# Patient Record
Sex: Male | Born: 1955 | Race: White | Hispanic: No | Marital: Single | Smoking: Current every day smoker
Health system: Southern US, Community
[De-identification: ages and names within clinical notes are randomized; demographics above are authoritative.]

## PROBLEM LIST (undated history)

## (undated) DIAGNOSIS — Z9981 Dependence on supplemental oxygen: Secondary | ICD-10-CM

## (undated) DIAGNOSIS — I10 Essential (primary) hypertension: Secondary | ICD-10-CM

## (undated) DIAGNOSIS — I82409 Acute embolism and thrombosis of unspecified deep veins of unspecified lower extremity: Secondary | ICD-10-CM

## (undated) DIAGNOSIS — M549 Dorsalgia, unspecified: Secondary | ICD-10-CM

## (undated) DIAGNOSIS — I2510 Atherosclerotic heart disease of native coronary artery without angina pectoris: Secondary | ICD-10-CM

## (undated) DIAGNOSIS — R634 Abnormal weight loss: Secondary | ICD-10-CM

## (undated) DIAGNOSIS — C649 Malignant neoplasm of unspecified kidney, except renal pelvis: Secondary | ICD-10-CM

## (undated) DIAGNOSIS — I208 Other forms of angina pectoris: Secondary | ICD-10-CM

## (undated) DIAGNOSIS — J449 Chronic obstructive pulmonary disease, unspecified: Secondary | ICD-10-CM

## (undated) DIAGNOSIS — E7800 Pure hypercholesterolemia, unspecified: Secondary | ICD-10-CM

## (undated) HISTORY — PX: BACK SURGERY: SHX140

## (undated) HISTORY — DX: Malignant neoplasm of unspecified kidney, except renal pelvis: C64.9

## (undated) HISTORY — DX: Acute embolism and thrombosis of unspecified deep veins of unspecified lower extremity: I82.409

## (undated) HISTORY — DX: Abnormal weight loss: R63.4

## (undated) HISTORY — DX: Dependence on supplemental oxygen: Z99.81

## (undated) HISTORY — PX: NECK SURGERY: SHX720

## (undated) HISTORY — DX: Dorsalgia, unspecified: M54.9

## (undated) HISTORY — DX: Chronic obstructive pulmonary disease, unspecified: J44.9

---

## 2008-12-22 ENCOUNTER — Emergency Department (HOSPITAL_COMMUNITY): Admission: EM | Admit: 2008-12-22 | Discharge: 2008-12-22 | Payer: Self-pay | Admitting: Emergency Medicine

## 2009-01-09 ENCOUNTER — Emergency Department (HOSPITAL_COMMUNITY): Admission: EM | Admit: 2009-01-09 | Discharge: 2009-01-09 | Payer: Self-pay | Admitting: Emergency Medicine

## 2009-05-06 ENCOUNTER — Emergency Department (HOSPITAL_COMMUNITY): Admission: EM | Admit: 2009-05-06 | Discharge: 2009-05-06 | Payer: Self-pay | Admitting: Emergency Medicine

## 2009-05-13 ENCOUNTER — Emergency Department (HOSPITAL_COMMUNITY): Admission: EM | Admit: 2009-05-13 | Discharge: 2009-05-13 | Payer: Self-pay | Admitting: Emergency Medicine

## 2009-05-25 ENCOUNTER — Emergency Department (HOSPITAL_COMMUNITY): Admission: EM | Admit: 2009-05-25 | Discharge: 2009-05-25 | Payer: Self-pay | Admitting: Emergency Medicine

## 2009-06-18 ENCOUNTER — Emergency Department (HOSPITAL_COMMUNITY): Admission: EM | Admit: 2009-06-18 | Discharge: 2009-06-18 | Payer: Self-pay | Admitting: Emergency Medicine

## 2009-08-04 ENCOUNTER — Emergency Department (HOSPITAL_COMMUNITY): Admission: EM | Admit: 2009-08-04 | Discharge: 2009-08-04 | Payer: Self-pay | Admitting: Emergency Medicine

## 2010-04-02 ENCOUNTER — Ambulatory Visit (HOSPITAL_COMMUNITY): Admission: RE | Admit: 2010-04-02 | Discharge: 2010-04-02 | Payer: Self-pay | Admitting: Family Medicine

## 2010-11-20 ENCOUNTER — Ambulatory Visit (HOSPITAL_COMMUNITY)
Admission: RE | Admit: 2010-11-20 | Discharge: 2010-11-20 | Payer: Self-pay | Source: Home / Self Care | Attending: Family Medicine | Admitting: Family Medicine

## 2010-12-21 ENCOUNTER — Encounter: Payer: Self-pay | Admitting: Family Medicine

## 2011-01-02 ENCOUNTER — Inpatient Hospital Stay (HOSPITAL_COMMUNITY)
Admission: RE | Admit: 2011-01-02 | Discharge: 2011-01-04 | DRG: 473 | Disposition: A | Discharge status: Home / Self Care | Payer: Medicaid Other | Source: Ambulatory Visit | Attending: Neurosurgery | Admitting: Neurosurgery

## 2011-01-02 ENCOUNTER — Ambulatory Visit (HOSPITAL_COMMUNITY): Payer: Medicaid Other

## 2011-01-02 DIAGNOSIS — F17200 Nicotine dependence, unspecified, uncomplicated: Secondary | ICD-10-CM | POA: Diagnosis present

## 2011-01-02 DIAGNOSIS — M5030 Other cervical disc degeneration, unspecified cervical region: Principal | ICD-10-CM | POA: Diagnosis present

## 2011-01-02 DIAGNOSIS — Z981 Arthrodesis status: Secondary | ICD-10-CM

## 2011-01-02 LAB — BASIC METABOLIC PANEL
BUN: 16 mg/dL (ref 6–23)
CO2: 29 mEq/L (ref 19–32)
Chloride: 104 mEq/L (ref 96–112)
GFR calc Af Amer: >60 mL/min (ref >60)
Glucose, Bld: 84 mg/dL (ref 70–99)
Potassium: 5.1 mEq/L (ref 3.5–5.1)
Sodium: 140 mEq/L (ref 135–145)

## 2011-01-02 LAB — CBC
HCT: 46.3 % (ref 39.0–52.0)
MCH: 30.9 pg (ref 26.0–34.0)
MCHC: 32.6 g/dL (ref 30.0–36.0)
Platelets: 252 10*3/uL (ref 150–400)
RDW: 13.9 % (ref 11.5–15.5)

## 2011-01-02 LAB — SURGICAL PCR SCREEN: Staphylococcus aureus: NEGATIVE

## 2011-01-13 NOTE — Op Note (Signed)
  [name], Eric Villarreal NO.:  [id number]  MEDICAL RECORD NO.:  [id number]           PATIENT TYPE:  I  LOCATION:  3035                         FACILITY:  [facility]  PHYSICIAN:  Hilda Lias, M.D.   DATE OF BIRTH:  September 15, 1956  DATE OF PROCEDURE:  01/02/2011 DATE OF DISCHARGE:                              OPERATIVE REPORT   PREOPERATIVE DIAGNOSIS:  Degenerative disk disease C5-6 and C6-7 with a chronic radiculopathy status post fusion anterior-posterior C7-T1.  POSTOPERATIVE DIAGNOSES:  Degenerative disk disease C5-6 and C6-7 with a chronic radiculopathy status post fusion anterior-posterior C7-T1.  PROCEDURE:  Anterior C5-6 and C6-7 diskectomy with decompression of the spinal cord, bilateral foraminotomy, interbody fusion with auto and allograft plate microscope.  SURGEON:  Hilda Lias, MD  ASSISTANT:  Hewitt Shorts, MD  CLINICAL HISTORY:  The patient is a 55 year old gentleman who underwent thee neck surgeries in [place], [place].  Two of them posterior, one anterior in the right side.  He came to my office complaining of neck pain with radiation to both upper extremities.  X-rays showed severe degenerative disk disease with stenosis of C5-6 and C6-7.  Surgery was advised.  PROCEDURE:  The patient was taken to the OR, and the right side of the neck was cleaned with DuraPrep.  Transverse incision about 2 inches from the previous one was made through the skin, subcutaneous tissue, platysma, down to cervical spine.  X-ray showed that we were at the C6- 7.  Two anterior osteophytes were removed, and we brought the microscope immediately.  With the drill and the microcurettes, we did decompression of C5-6 and C6-7.  Using the 1- and 2-mm Kerrison punch, we opened the posterior ligament and decompressed the spinal cord as well as the C6 nerve root and C7 at the level of C6-7 was done.  The endplate were drilled.  Two pieces of allograft, 7 mm height,  lordotic, with autograft inside, were inserted followed by a plate using 6 screws.  Lateral x- rays showed good position of the graft and the plate.  We achieved good hemostasis, but although he has previous surgery with obvious scar we left a drain.  The wound was closed with Vicryl and Steri-Strips.          ______________________________ Hilda Lias, M.D.     EB/MEDQ  D:  01/02/2011  T:  01/03/2011  Job:  [id number]  Electronically Signed by Hilda Lias M.D. on 01/07/2011 09:41:42 AM

## 2012-07-15 ENCOUNTER — Emergency Department (HOSPITAL_COMMUNITY)
Admission: EM | Admit: 2012-07-15 | Discharge: 2012-07-15 | Disposition: A | Discharge status: Home / Self Care | Payer: Medicaid Other | Attending: Emergency Medicine | Admitting: Emergency Medicine

## 2012-07-15 ENCOUNTER — Encounter (HOSPITAL_COMMUNITY): Payer: Self-pay | Admitting: *Deleted

## 2012-07-15 DIAGNOSIS — L03317 Cellulitis of buttock: Secondary | ICD-10-CM | POA: Insufficient documentation

## 2012-07-15 DIAGNOSIS — F17200 Nicotine dependence, unspecified, uncomplicated: Secondary | ICD-10-CM | POA: Insufficient documentation

## 2012-07-15 DIAGNOSIS — L0231 Cutaneous abscess of buttock: Secondary | ICD-10-CM | POA: Insufficient documentation

## 2012-07-15 MED ORDER — OXYCODONE-ACETAMINOPHEN 5-325 MG PO TABS: ORAL_TABLET | ORAL | Status: DC

## 2012-07-15 NOTE — ED Provider Notes (Signed)
History     CSN: [id number]  Arrival date & time 07/15/12  Eric Villarreal   First MD Initiated Contact with Patient 07/15/12 2015      Chief Complaint  Patient presents with  . 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  States he saw his surgeon in [place] who started him on clindamycin 3  Days ago.  He can see him again on Monday.  Wants something for pain in the meantime.  Patient is a 56 y.o. male presenting with abscess. The history is provided by the patient. No language interpreter was used.  Abscess  This is a new problem. Episode onset: 3 days ago. The problem has been gradually worsening. Affected Location: R buttock. The abscess is characterized by painfulness and swelling. Pertinent negatives include no fever. There were no sick contacts.    History reviewed. No pertinent past medical history.  Past Surgical History  Procedure Date  . Neck surgery     No family history on file.  History  Substance Use Topics  . Smoking status: Current Everyday Smoker -- 2.0 packs/day  . Smokeless tobacco: Not on file  . Alcohol Use: No      Review of Systems  Constitutional: Negative for fever and chills.  Skin:       Abscess   All other systems reviewed and are negative.    Allergies  Doxycycline  Home Medications   Current Outpatient Rx  Name Route Sig Dispense Refill  . CLINDAMYCIN HCL 150 MG PO CAPS Oral Take 150 mg by mouth 4 (four) times daily. For 10 days    . OXYCODONE-ACETAMINOPHEN 5-325 MG PO TABS  One tab po q 4-6 hrs prn pain 20 tablet 0    BP 121/77  Pulse 85  Temp 98.3 F (36.8 C)  Resp 20  Ht 6\' 4"  (1.93 m)  Wt 185 lb (83.915 kg)  BMI 22.52 kg/m2  SpO2 98%  Physical Exam  Nursing note and vitals reviewed. Constitutional: He is oriented to person, place, and time. He appears well-developed and  well-nourished.  HENT:  Head: Normocephalic and atraumatic.  Eyes: EOM are normal.  Neck: Normal range of motion.  Cardiovascular: Normal rate, regular rhythm, normal heart sounds and intact distal pulses.   Pulmonary/Chest: Effort normal and breath sounds normal. No respiratory distress.  Abdominal: Soft. He exhibits no distension. There is no tenderness.  Musculoskeletal: Normal range of motion.       Legs: Neurological: He is alert and oriented to person, place, and time.  Skin: Skin is warm and dry.  Psychiatric: He has a normal mood and affect. Judgment normal.    ED Course  Procedures (including critical care time)  Labs Reviewed - No data to display No results found.   1. Abscess of right buttock       MDM  Continue clindamycin rx-percocet, 20 Ibuprofen  Warm compresses F/u with your surgeon on Monday.        Evalina Field, [place] 07/15/12 2055

## 2012-07-15 NOTE — ED Notes (Signed)
Pt with an abscess to right buttock, hx of same multiple times, multiple scars to bottom

## 2012-07-15 NOTE — ED Notes (Signed)
Boil on right buttock, states he is taking clindamycin for same

## 2012-07-16 NOTE — ED Provider Notes (Signed)
Medical screening examination/treatment/procedure(s) were performed by non-physician practitioner and as supervising physician I was immediately available for consultation/collaboration.   Zasha Belleau III, MD 07/16/12 1252 

## 2016-05-04 ENCOUNTER — Other Ambulatory Visit (HOSPITAL_COMMUNITY): Payer: Self-pay | Admitting: Family Medicine

## 2016-05-04 ENCOUNTER — Ambulatory Visit (HOSPITAL_COMMUNITY)
Admission: RE | Admit: 2016-05-04 | Discharge: 2016-05-04 | Disposition: A | Discharge status: Home / Self Care | Payer: Medicaid - Out of State | Source: Ambulatory Visit | Attending: Family Medicine | Admitting: Family Medicine

## 2016-05-04 DIAGNOSIS — J449 Chronic obstructive pulmonary disease, unspecified: Secondary | ICD-10-CM | POA: Diagnosis present

## 2016-05-06 ENCOUNTER — Other Ambulatory Visit (HOSPITAL_COMMUNITY): Payer: Self-pay | Admitting: Family Medicine

## 2016-05-06 DIAGNOSIS — R918 Other nonspecific abnormal finding of lung field: Secondary | ICD-10-CM

## 2016-05-07 ENCOUNTER — Ambulatory Visit (HOSPITAL_COMMUNITY)
Admission: RE | Admit: 2016-05-07 | Discharge: 2016-05-07 | Disposition: A | Discharge status: Home / Self Care | Payer: Medicaid - Out of State | Source: Ambulatory Visit | Attending: Family Medicine | Admitting: Family Medicine

## 2016-05-07 DIAGNOSIS — R918 Other nonspecific abnormal finding of lung field: Secondary | ICD-10-CM

## 2016-05-08 ENCOUNTER — Inpatient Hospital Stay (HOSPITAL_COMMUNITY)
Admission: EM | Admit: 2016-05-08 | Discharge: 2016-05-12 | DRG: 175 | Disposition: A | Discharge status: Home / Self Care | Payer: Medicaid - Out of State | Attending: Internal Medicine | Admitting: Internal Medicine

## 2016-05-08 ENCOUNTER — Observation Stay (HOSPITAL_COMMUNITY): Payer: Medicaid - Out of State

## 2016-05-08 ENCOUNTER — Emergency Department (HOSPITAL_COMMUNITY): Payer: Medicaid - Out of State

## 2016-05-08 ENCOUNTER — Encounter (HOSPITAL_COMMUNITY): Payer: Self-pay

## 2016-05-08 DIAGNOSIS — I82220 Acute embolism and thrombosis of inferior vena cava: Secondary | ICD-10-CM | POA: Diagnosis not present

## 2016-05-08 DIAGNOSIS — I2699 Other pulmonary embolism without acute cor pulmonale: Secondary | ICD-10-CM | POA: Insufficient documentation

## 2016-05-08 DIAGNOSIS — L732 Hidradenitis suppurativa: Secondary | ICD-10-CM | POA: Diagnosis present

## 2016-05-08 DIAGNOSIS — R05 Cough: Secondary | ICD-10-CM | POA: Diagnosis present

## 2016-05-08 DIAGNOSIS — J449 Chronic obstructive pulmonary disease, unspecified: Secondary | ICD-10-CM | POA: Diagnosis present

## 2016-05-08 DIAGNOSIS — E222 Syndrome of inappropriate secretion of antidiuretic hormone: Secondary | ICD-10-CM | POA: Diagnosis not present

## 2016-05-08 DIAGNOSIS — J9601 Acute respiratory failure with hypoxia: Secondary | ICD-10-CM | POA: Diagnosis not present

## 2016-05-08 DIAGNOSIS — C799 Secondary malignant neoplasm of unspecified site: Secondary | ICD-10-CM

## 2016-05-08 DIAGNOSIS — E7800 Pure hypercholesterolemia, unspecified: Secondary | ICD-10-CM | POA: Diagnosis not present

## 2016-05-08 DIAGNOSIS — I2510 Atherosclerotic heart disease of native coronary artery without angina pectoris: Secondary | ICD-10-CM | POA: Diagnosis not present

## 2016-05-08 DIAGNOSIS — I10 Essential (primary) hypertension: Secondary | ICD-10-CM | POA: Diagnosis present

## 2016-05-08 DIAGNOSIS — R109 Unspecified abdominal pain: Secondary | ICD-10-CM

## 2016-05-08 DIAGNOSIS — J690 Pneumonitis due to inhalation of food and vomit: Secondary | ICD-10-CM | POA: Diagnosis present

## 2016-05-08 DIAGNOSIS — C642 Malignant neoplasm of left kidney, except renal pelvis: Secondary | ICD-10-CM | POA: Diagnosis not present

## 2016-05-08 DIAGNOSIS — C7801 Secondary malignant neoplasm of right lung: Secondary | ICD-10-CM | POA: Diagnosis present

## 2016-05-08 DIAGNOSIS — E119 Type 2 diabetes mellitus without complications: Secondary | ICD-10-CM | POA: Diagnosis present

## 2016-05-08 DIAGNOSIS — A419 Sepsis, unspecified organism: Secondary | ICD-10-CM | POA: Diagnosis present

## 2016-05-08 DIAGNOSIS — D649 Anemia, unspecified: Secondary | ICD-10-CM | POA: Diagnosis present

## 2016-05-08 DIAGNOSIS — Z681 Body mass index (BMI) 19 or less, adult: Secondary | ICD-10-CM | POA: Diagnosis not present

## 2016-05-08 DIAGNOSIS — C649 Malignant neoplasm of unspecified kidney, except renal pelvis: Secondary | ICD-10-CM | POA: Insufficient documentation

## 2016-05-08 DIAGNOSIS — C7802 Secondary malignant neoplasm of left lung: Secondary | ICD-10-CM | POA: Diagnosis not present

## 2016-05-08 DIAGNOSIS — E43 Unspecified severe protein-calorie malnutrition: Secondary | ICD-10-CM | POA: Insufficient documentation

## 2016-05-08 DIAGNOSIS — F17210 Nicotine dependence, cigarettes, uncomplicated: Secondary | ICD-10-CM | POA: Diagnosis not present

## 2016-05-08 DIAGNOSIS — R918 Other nonspecific abnormal finding of lung field: Secondary | ICD-10-CM

## 2016-05-08 HISTORY — DX: Pure hypercholesterolemia, unspecified: E78.00

## 2016-05-08 HISTORY — DX: Other forms of angina pectoris: I20.8

## 2016-05-08 HISTORY — DX: Essential (primary) hypertension: I10

## 2016-05-08 HISTORY — DX: Atherosclerotic heart disease of native coronary artery without angina pectoris: I25.10

## 2016-05-08 LAB — CBC WITH DIFFERENTIAL/PLATELET
BASOS ABS: 0 10*3/uL (ref 0.0–0.1)
Basophils Relative: 0 %
EOS ABS: 0.1 10*3/uL (ref 0.0–0.7)
EOS PCT: 0 %
HCT: 46.9 % (ref 39.0–52.0)
Hemoglobin: 15.1 g/dL (ref 13.0–17.0)
Lymphocytes Relative: 13 %
Lymphs Abs: 2 10*3/uL (ref 0.7–4.0)
MCH: 25.6 pg — ABNORMAL LOW (ref 26.0–34.0)
MCHC: 32.2 g/dL (ref 30.0–36.0)
MCV: 79.6 fL (ref 78.0–100.0)
Monocytes Absolute: 0.4 10*3/uL (ref 0.1–1.0)
Monocytes Relative: 2 %
Neutro Abs: 13.6 10*3/uL — ABNORMAL HIGH (ref 1.7–7.7)
Neutrophils Relative %: 85 %
Platelets: 396 10*3/uL (ref 150–400)
RBC: 5.89 MIL/uL — AB (ref 4.22–5.81)
RDW: 19 % — AB (ref 11.5–15.5)
WBC: 16.1 10*3/uL — AB (ref 4.0–10.5)

## 2016-05-08 LAB — URINALYSIS, ROUTINE W REFLEX MICROSCOPIC
Glucose, UA: 100 mg/dL — AB
Leukocytes, UA: NEGATIVE
Nitrite: POSITIVE — AB
PH: 5 (ref 5.0–8.0)
Protein, ur: 100 mg/dL — AB
SPECIFIC GRAVITY, URINE: 1.02 (ref 1.005–1.030)

## 2016-05-08 LAB — COMPREHENSIVE METABOLIC PANEL
ALBUMIN: 2.5 g/dL — AB (ref 3.5–5.0)
ALK PHOS: 179 U/L — AB (ref 38–126)
ALT: 12 U/L — ABNORMAL LOW (ref 17–63)
ANION GAP: 9 (ref 5–15)
AST: 17 U/L (ref 15–41)
BILIRUBIN TOTAL: 1 mg/dL (ref 0.3–1.2)
BUN: 28 mg/dL — AB (ref 6–20)
CALCIUM: 10.2 mg/dL (ref 8.9–10.3)
CO2: 28 mmol/L (ref 22–32)
Chloride: 93 mmol/L — ABNORMAL LOW (ref 101–111)
Creatinine, Ser: 1.11 mg/dL (ref 0.61–1.24)
GFR calc Af Amer: >60 mL/min (ref >60)
GLUCOSE: 101 mg/dL — AB (ref 65–99)
POTASSIUM: 4.3 mmol/L (ref 3.5–5.1)
Sodium: 130 mmol/L — ABNORMAL LOW (ref 135–145)
TOTAL PROTEIN: 7.3 g/dL (ref 6.5–8.1)

## 2016-05-08 LAB — TYPE AND SCREEN
ABO/RH(D): A POS
Antibody Screen: NEGATIVE

## 2016-05-08 LAB — PROTIME-INR
INR: 1.12 (ref 0.00–1.49)
Prothrombin Time: 14.6 seconds (ref 11.6–15.2)

## 2016-05-08 LAB — URINE MICROSCOPIC-ADD ON

## 2016-05-08 LAB — CBG MONITORING, ED: Glucose-Capillary: 127 mg/dL — ABNORMAL HIGH (ref 65–99)

## 2016-05-08 LAB — I-STAT CG4 LACTIC ACID, ED: Lactic Acid, Venous: 2 mmol/L (ref 0.5–2.0)

## 2016-05-08 MED ORDER — VANCOMYCIN HCL IN DEXTROSE 1-5 GM/200ML-% IV SOLN
1000.0000 mg | Freq: Once | INTRAVENOUS | Status: AC
  Administered 2016-05-08: 1000 mg via INTRAVENOUS
  Filled 2016-05-08: qty 200

## 2016-05-08 MED ORDER — HEPARIN (PORCINE) IN NACL 100-0.45 UNIT/ML-% IJ SOLN
12.0000 [IU]/kg/h | INTRAMUSCULAR | Status: DC
  Administered 2016-05-08: 12 [IU]/kg/h via INTRAVENOUS
  Filled 2016-05-08: qty 250

## 2016-05-08 MED ORDER — PIPERACILLIN-TAZOBACTAM 3.375 G IVPB 30 MIN
3.3750 g | Freq: Once | INTRAVENOUS | Status: AC
  Administered 2016-05-08: 3.375 g via INTRAVENOUS
  Filled 2016-05-08: qty 50

## 2016-05-08 MED ORDER — HEPARIN BOLUS VIA INFUSION
4000.0000 [IU] | Freq: Once | INTRAVENOUS | Status: AC
  Administered 2016-05-08: 4000 [IU] via INTRAVENOUS

## 2016-05-08 MED ORDER — IOPAMIDOL (ISOVUE-370) INJECTION 76%
100.0000 mL | Freq: Once | INTRAVENOUS | Status: AC | PRN
  Administered 2016-05-08: 100 mL via INTRAVENOUS

## 2016-05-08 MED ORDER — SODIUM CHLORIDE 0.9 % IV BOLUS (SEPSIS)
1000.0000 mL | Freq: Once | INTRAVENOUS | Status: AC
  Administered 2016-05-08: 1000 mL via INTRAVENOUS

## 2016-05-08 NOTE — ED Notes (Signed)
C/o increased weakness, decreased PO intake, "spitting up blood" and "blood in stool"

## 2016-05-08 NOTE — ED Notes (Signed)
Sats 85% when entering the room. Pt placed on 2L nasal cannula. Pt has hx of COPD, but does not wear oxygen at home.

## 2016-05-08 NOTE — ED Notes (Signed)
Pt given a urinal. Pt informed that a urine specimen was needed. Verbalized understanding and will notify staff when one can be obtained.

## 2016-05-08 NOTE — ED Notes (Signed)
EDP at bedisde 

## 2016-05-08 NOTE — ED Provider Notes (Signed)
CSN: [id number]     Arrival date & time 05/08/16  1558 History   First MD Initiated Contact with Patient 05/08/16 1618     Chief Complaint  Patient presents with  . Hematemesis     (Consider location/radiation/quality/duration/timing/severity/associated sxs/prior Treatment) HPI  The patient is a 60 year old male, he has a history of long-time smoker, COPD, he was diagnosed recently with a right upper lung infiltrate consistent with a pneumonia after he had been having some increased coughing. His doctor ordered a CT scan of the chest and abdomen and pelvis which unfortunately did not get done last night and thus he presents to the hospital today requesting that he get done but also complaining that he has been having some hemoptysis as well as some bright red blood per rectum which has been intermittent. The hemoptysis has just been over the last couple of days, it is increasing in frequency and volume. He feels weak, fatigued, no energy but denies swelling of the legs. He does not have any history of cancer or heart disease that he knows of. He does smoke 2 packs a day.  Past Medical History  Diagnosis Date  . Angina at rest [facility])   . Hypertension   . Diabetes mellitus without complication ([place])   . Coronary artery disease   . High cholesterol    Past Surgical History  Procedure Laterality Date  . Neck surgery    . Back surgery     History reviewed. No pertinent family history. Social History  Substance Use Topics  . Smoking status: Current Every Day Smoker -- 2.00 packs/day  . Smokeless tobacco: None  . Alcohol Use: No    Review of Systems  All other systems reviewed and are negative.     Allergies  Doxycycline  Home Medications   Prior to Admission medications   Medication Sig Start Date End Date Taking? Authorizing Provider  albuterol (PROVENTIL HFA;VENTOLIN HFA) 108 (90 Base) MCG/ACT inhaler Inhale 1-2 puffs into the lungs every 6 (six) hours as needed for wheezing  or shortness of breath.   Yes Historical Provider, MD  dapsone 100 MG tablet Take 150 mg by mouth daily.   Yes Historical Provider, MD  levofloxacin (LEVAQUIN) 750 MG tablet Take 750 mg by mouth daily. 10 day course starting on 05/06/2016 05/06/16  Yes Historical Provider, MD  LORazepam (ATIVAN) 1 MG tablet Take 1 mg by mouth at bedtime.   Yes Historical Provider, MD  minocycline (MINOCIN,DYNACIN) 100 MG capsule Take 100 mg by mouth daily.   Yes Historical Provider, MD  oxyCODONE (ROXICODONE) 15 MG immediate release tablet Take 15 mg by mouth 4 (four) times daily. 05/04/16  Yes Historical Provider, MD  umeclidinium-vilanterol (ANORO ELLIPTA) 62.5-25 MCG/INH AEPB Inhale 1 puff into the lungs daily.   Yes Historical Provider, MD   BP 93/65 mmHg  Pulse 78  Temp(Src) 97.6 F (36.4 C) (Oral)  Resp 15  Ht 6\' 4"  (1.93 m)  Wt 151 lb (68.493 kg)  BMI 18.39 kg/m2  SpO2 92% Physical Exam  Constitutional: He appears well-developed and well-nourished. He appears distressed.  HENT:  Head: Normocephalic and atraumatic.  Mouth/Throat: Oropharynx is clear and moist. No oropharyngeal exudate.  Eyes: Conjunctivae and EOM are normal. Pupils are equal, round, and reactive to light. Right eye exhibits no discharge. Left eye exhibits no discharge. No scleral icterus.  Neck: Normal range of motion. Neck supple. No JVD present. No thyromegaly present.  Cardiovascular: Regular rhythm, normal heart sounds and intact distal  pulses.  Exam reveals no gallop and no friction rub.   No murmur heard. tachycardic  Pulmonary/Chest: Effort normal. No respiratory distress. He has wheezes. He has rales.  hypoxic  Abdominal: Soft. Bowel sounds are normal. He exhibits no distension and no mass. There is no tenderness.  Musculoskeletal: Normal range of motion. He exhibits no edema or tenderness.  Lymphadenopathy:    He has no cervical adenopathy.  Neurological: He is alert. Coordination normal.  Skin: Skin is warm and dry. No  rash noted. No erythema.  Psychiatric: He has a normal mood and affect. His behavior is normal.  Nursing note and vitals reviewed.   ED Course  .Central Line Date/Time: 05/08/2016 9:56 PM Performed by: Noemi Chapel Authorized by: Noemi Chapel Consent: Verbal consent obtained. Written consent obtained. Risks and benefits: risks, benefits and alternatives were discussed Consent given by: patient Patient understanding: patient states understanding of the procedure being performed Patient consent: the patient's understanding of the procedure matches consent given Procedure consent: procedure consent matches procedure scheduled Relevant documents: relevant documents present and verified Test results: test results available and properly labeled Site marked: the operative site was marked Imaging studies: imaging studies available Required items: required blood products, implants, devices, and special equipment available Patient identity confirmed: verbally with patient Time out: Immediately prior to procedure a "time out" was called to verify the correct patient, procedure, equipment, support staff and site/side marked as required. Indications: vascular access and central pressure monitoring Anesthesia: local infiltration Local anesthetic: lidocaine 1% without epinephrine Anesthetic total: 1 ml Patient sedated: no Preparation: skin prepped with ChloraPrep Skin prep agent dried: skin prep agent completely dried prior to procedure Sterile barriers: all five maximum sterile barriers used - cap, mask, sterile gown, sterile gloves, and large sterile sheet Hand hygiene: hand hygiene performed prior to central venous catheter insertion Location details: right internal jugular Patient position: flat Catheter type: triple lumen Pre-procedure: landmarks identified Ultrasound guidance: yes Sterile ultrasound techniques: sterile gel and sterile probe covers were used Number of attempts:  1 Successful placement: yes Post-procedure: line sutured and dressing applied Assessment: blood return through all ports,  free fluid flow,  placement verified by x-ray and no pneumothorax on x-ray Patient tolerance: Patient tolerated the procedure well with no immediate complications   (including critical care time) Labs Review Labs Reviewed  CBC WITH DIFFERENTIAL/PLATELET - Abnormal; Notable for the following:    WBC 16.1 (*)    RBC 5.89 (*)    MCH 25.6 (*)    RDW 19.0 (*)    Neutro Abs 13.6 (*)    All other components within normal limits  COMPREHENSIVE METABOLIC PANEL - Abnormal; Notable for the following:    Sodium 130 (*)    Chloride 93 (*)    Glucose, Bld 101 (*)    BUN 28 (*)    Albumin 2.5 (*)    ALT 12 (*)    Alkaline Phosphatase 179 (*)    All other components within normal limits  URINALYSIS, ROUTINE W REFLEX MICROSCOPIC (NOT AT [facility]) - Abnormal; Notable for the following:    Color, Urine AMBER (*)    Glucose, UA 100 (*)    Hgb urine dipstick LARGE (*)    Bilirubin Urine MODERATE (*)    Ketones, ur TRACE (*)    Protein, ur 100 (*)    Nitrite POSITIVE (*)    All other components within normal limits  URINE MICROSCOPIC-ADD ON - Abnormal; Notable for the following:    Squamous  Epithelial / LPF 0-5 (*)    Bacteria, UA MANY (*)    Casts RED CELL CAST (*)    All other components within normal limits  CULTURE, BLOOD (ROUTINE X 2)  CULTURE, BLOOD (ROUTINE X 2)  URINE CULTURE  PROTIME-INR  I-STAT CG4 LACTIC ACID, ED  I-STAT CG4 LACTIC ACID, ED  TYPE AND SCREEN    Imaging Review Ct Angio Chest Pe W/cm &/or Wo Cm  05/08/2016  CLINICAL DATA:  Spitting up blood. Blood in stool. Weakness. Angina. EXAM: CT ANGIOGRAPHY CHEST CT ABDOMEN AND PELVIS WITH CONTRAST TECHNIQUE: Multidetector CT imaging of the chest was performed using the standard protocol during bolus administration of intravenous contrast. Multiplanar CT image reconstructions and MIPs were obtained to evaluate  the vascular anatomy. Multidetector CT imaging of the abdomen and pelvis was performed using the standard protocol during bolus administration of intravenous contrast. CONTRAST:  100 mL of Isovue 370 COMPARISON:  None FINDINGS: CTA CHEST FINDINGS The central airways are normal. Evaluation of the lungs is somewhat limited due to respiratory motion. There is widespread infiltrate in the right upper lobe consistent with pneumonia. There are innumerable nodules throughout both lungs consistent with metastatic disease. A representative nodule posteriorly in the right lung base on series 10, image 123 measures 2.7 x 2.1 cm. There is enlarged lymph node in the right hilum on series 8, image 81 measuring 3.9 by 3.4 cm. There is a prominent subcarinal node. Mildly prominent nodes seen in the mediastinum. The thoracic aorta measures 4.2 cm improved. No dissection. No pleural or pericardial effusions. There are coronary artery calcifications. The study is positive for pulmonary emboli most prominent in the right upper and right middle lobes. The embolus burden is relatively mild. No convincing evidence of heart strain. CT ABDOMEN and PELVIS FINDINGS No free air. The patient is cachectic and there is evidence of volume load with increased attenuation in the subcutaneous and abdominal fat. There is a large mass in the left kidney measuring 8.8 by 7.8 by 9.9 cm, centered in the lower pole. There is no extension of tumor into the peripheral left renal vein. There is extensive thrombus in the IVC, probably extending inferiorly into the iliac vessels. The thrombus extends to the level of the renal vein drainage. There is at least 1 small filling defect in the central left renal vein but there is no definitive connection to the left renal mass. The defect in the left renal vein is best seen on coronal image 59. The right kidney is normal. The intrahepatic portions of the portal vein are patent. There does appear to be thrombus within  the confluence of the splenic and superior mesenteric veins best seen on coronal image 43. The attenuation of the liver is somewhat heterogeneous but no discrete metastases are seen. The gallbladder is unremarkable. There is a mass in the medial spleen on axial image 18. While nonspecific, a metastatic lesion is possible measuring up to 2.6 cm. The right adrenal gland and pancreas are normal. No discrete nodules seen in the left adrenal gland. There are multiple abnormal lymph nodes in the left side of the retroperitoneum between the kidney in the aorta. A representative node to the left of the aorta on axial image 31 and measures 2.6 by 2.1 cm. The abdominal aorta is normal in caliber. The stomach is poorly evaluated but grossly unremarkable. The small bowel is nonobstructed. The colon is normal. The appendix not visualized but there is no evidence of appendicitis. The pelvis  demonstrates probable extension of thrombus into the iliac vessels. Pelvic loops of bowel are unremarkable. The bladder is thick-walled but otherwise unremarkable. No discrete adenopathy. No filling defects in the renal collecting systems on delayed images. No bony metastatic disease. Review of the MIP images confirms the above findings. IMPRESSION: 1. The study is positive for pulmonary emboli. There is extensive thrombus in the IVC extending inferiorly into the iliac veins. There is a small amount of thrombus in the left renal vein as well as a small amount of thrombus in the splenic and superior mesenteric veins. 2. 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 A mass in the spleen is nonspecific but could represent a metastatic lesion. 3. Right upper lobe pneumonia. 4. 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 Findings called to Dr. Noemi Chapel Electronically Signed   By: Dorise Bullion III M.D   On: 05/08/2016 19:35   Ct Abdomen Pelvis W  Contrast  05/08/2016  CLINICAL DATA:  Spitting up blood. Blood in stool. Weakness. Angina. EXAM: CT ANGIOGRAPHY CHEST CT ABDOMEN AND PELVIS WITH CONTRAST TECHNIQUE: Multidetector CT imaging of the chest was performed using the standard protocol during bolus administration of intravenous contrast. Multiplanar CT image reconstructions and MIPs were obtained to evaluate the vascular anatomy. Multidetector CT imaging of the abdomen and pelvis was performed using the standard protocol during bolus administration of intravenous contrast. CONTRAST:  100 mL of Isovue 370 COMPARISON:  None FINDINGS: CTA CHEST FINDINGS The central airways are normal. Evaluation of the lungs is somewhat limited due to respiratory motion. There is widespread infiltrate in the right upper lobe consistent with pneumonia. There are innumerable nodules throughout both lungs consistent with metastatic disease. A representative nodule posteriorly in the right lung base on series 10, image 123 measures 2.7 x 2.1 cm. There is enlarged lymph node in the right hilum on series 8, image 81 measuring 3.9 by 3.4 cm. There is a prominent subcarinal node. Mildly prominent nodes seen in the mediastinum. The thoracic aorta measures 4.2 cm improved. No dissection. No pleural or pericardial effusions. There are coronary artery calcifications. The study is positive for pulmonary emboli most prominent in the right upper and right middle lobes. The embolus burden is relatively mild. No convincing evidence of heart strain. CT ABDOMEN and PELVIS FINDINGS No free air. The patient is cachectic and there is evidence of volume load with increased attenuation in the subcutaneous and abdominal fat. There is a large mass in the left kidney measuring 8.8 by 7.8 by 9.9 cm, centered in the lower pole. There is no extension of tumor into the peripheral left renal vein. There is extensive thrombus in the IVC, probably extending inferiorly into the iliac vessels. The thrombus  extends to the level of the renal vein drainage. There is at least 1 small filling defect in the central left renal vein but there is no definitive connection to the left renal mass. The defect in the left renal vein is best seen on coronal image 59. The right kidney is normal. The intrahepatic portions of the portal vein are patent. There does appear to be thrombus within the confluence of the splenic and superior mesenteric veins best seen on coronal image 43. The attenuation of the liver is somewhat heterogeneous but no discrete metastases are seen. The gallbladder is unremarkable. There is a mass in the medial spleen on axial image 18. While nonspecific, a metastatic lesion is possible measuring  up to 2.6 cm. The right adrenal gland and pancreas are normal. No discrete nodules seen in the left adrenal gland. There are multiple abnormal lymph nodes in the left side of the retroperitoneum between the kidney in the aorta. A representative node to the left of the aorta on axial image 31 and measures 2.6 by 2.1 cm. The abdominal aorta is normal in caliber. The stomach is poorly evaluated but grossly unremarkable. The small bowel is nonobstructed. The colon is normal. The appendix not visualized but there is no evidence of appendicitis. The pelvis demonstrates probable extension of thrombus into the iliac vessels. Pelvic loops of bowel are unremarkable. The bladder is thick-walled but otherwise unremarkable. No discrete adenopathy. No filling defects in the renal collecting systems on delayed images. No bony metastatic disease. Review of the MIP images confirms the above findings. IMPRESSION: 1. The study is positive for pulmonary emboli. There is extensive thrombus in the IVC extending inferiorly into the iliac veins. There is a small amount of thrombus in the left renal vein as well as a small amount of thrombus in the splenic and superior mesenteric veins. 2. Left renal cell carcinoma with metastatic disease to the  lungs, right hilum, abdominal nodes, and probably mediastinal nodes. A mass in the spleen is nonspecific but could represent a metastatic lesion. 3. Right upper lobe pneumonia. 4. 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 Findings called to Dr. Noemi Chapel Electronically Signed   By: Dorise Bullion III M.D   On: 05/08/2016 19:35   I have personally reviewed and evaluated these images and lab results as part of my medical decision-making.    MDM   Final diagnoses:  Sepsis, due to unspecified organism [facility])  Other acute pulmonary embolism without acute cor pulmonale ([place])  Metastatic cancer ([place])    The patient appears mildly hypoxic, tachycardic, he is afebrile but has abnormal lung sounds and a very abnormal chest x-ray performed earlier in the week which will require further evaluation with CT scan. Due to hemoptysis though this could be related to a cancerous mass or an infection it could also be related to a pulmonary embolism and given his low oxygen I will perform an angiogram of the chest as well as a CT abdomen and pelvis to look for any signs of metastatic disease. The patient will likely need to be admitted to the hospital due to hypotension, tachycardia and hypoxia. Sepsis orders have been ordered, the patient is in agreement with the plan. IV fluids ordered as well.  I have personally seen and interpreted the x-rays, I agree with the radiologist interpretation that there does appear to be metastatic cancer, there does appear to be pneumonia and there does appear to be pulmonary embolism. The patient is a large amount of clot in the iliac and IVC, visible pulmonary embolism as well as what appears to be pneumonia. The patient will be given heparin drip, intravenous Levaquin, another fluid bolus as he is persistently hypotensive. The patient does appear critically ill however his first lactate was only 2. This is consistent with sepsis but at this time does not require 30 mL/kg of  fluid and was he needs it for pressure support which at this time he does. We'll discuss with the hospitalist for admission. Likely need stepdown or intensive care unit  D/w Dr. Corinna Lines with E link - ICU - accepted pt to ICU at [place] Performed by: Johnna Acosta Total critical care time: 35 minutes Critical  care time was exclusive of separately billable procedures and treating other patients. Critical care was necessary to treat or prevent imminent or life-threatening deterioration. Critical care was time spent personally by me on the following activities: development of treatment plan with patient and/or surrogate as well as nursing, discussions with consultants, evaluation of patient's response to treatment, examination of patient, obtaining history from patient or surrogate, ordering and performing treatments and interventions, ordering and review of laboratory studies, ordering and review of radiographic studies, pulse oximetry and re-evaluation of patient's condition.  Meds given in ED:  Medications  heparin ADULT infusion 100 units/mL (25000 units/266mL sodium chloride 0.45%) (12 Units/kg/hr  68.5 kg Intravenous Transfusing/Transfer 05/08/16 2139)  vancomycin (VANCOCIN) IVPB 1000 mg/200 mL premix (1,000 mg Intravenous New Bag/Given 05/08/16 2109)  piperacillin-tazobactam (ZOSYN) IVPB 3.375 g (not administered)  sodium chloride 0.9 % bolus 1,000 mL (0 mLs Intravenous Stopped 05/08/16 1752)  iopamidol (ISOVUE-370) 76 % injection 100 mL (100 mLs Intravenous Contrast Given 05/08/16 1818)  heparin bolus via infusion 4,000 Units (4,000 Units Intravenous Given 05/08/16 2003)  sodium chloride 0.9 % bolus 1,000 mL (1,000 mLs Intravenous New Bag/Given 05/08/16 2109)        Noemi Chapel, MD 05/08/16 2157

## 2016-05-08 NOTE — ED Notes (Signed)
Pt's BP dropping in the high 80s. Second liter of fluids started. Dr. Sabra Heck notified.

## 2016-05-09 DIAGNOSIS — F17210 Nicotine dependence, cigarettes, uncomplicated: Secondary | ICD-10-CM | POA: Diagnosis present

## 2016-05-09 DIAGNOSIS — C801 Malignant (primary) neoplasm, unspecified: Secondary | ICD-10-CM | POA: Diagnosis not present

## 2016-05-09 DIAGNOSIS — J9601 Acute respiratory failure with hypoxia: Secondary | ICD-10-CM | POA: Diagnosis present

## 2016-05-09 DIAGNOSIS — I2510 Atherosclerotic heart disease of native coronary artery without angina pectoris: Secondary | ICD-10-CM | POA: Diagnosis present

## 2016-05-09 DIAGNOSIS — Z681 Body mass index (BMI) 19 or less, adult: Secondary | ICD-10-CM | POA: Diagnosis not present

## 2016-05-09 DIAGNOSIS — C7801 Secondary malignant neoplasm of right lung: Secondary | ICD-10-CM

## 2016-05-09 DIAGNOSIS — E43 Unspecified severe protein-calorie malnutrition: Secondary | ICD-10-CM | POA: Diagnosis present

## 2016-05-09 DIAGNOSIS — I10 Essential (primary) hypertension: Secondary | ICD-10-CM | POA: Diagnosis present

## 2016-05-09 DIAGNOSIS — J690 Pneumonitis due to inhalation of food and vomit: Secondary | ICD-10-CM | POA: Diagnosis present

## 2016-05-09 DIAGNOSIS — C649 Malignant neoplasm of unspecified kidney, except renal pelvis: Secondary | ICD-10-CM

## 2016-05-09 DIAGNOSIS — E222 Syndrome of inappropriate secretion of antidiuretic hormone: Secondary | ICD-10-CM | POA: Diagnosis present

## 2016-05-09 DIAGNOSIS — E119 Type 2 diabetes mellitus without complications: Secondary | ICD-10-CM | POA: Diagnosis present

## 2016-05-09 DIAGNOSIS — C7802 Secondary malignant neoplasm of left lung: Secondary | ICD-10-CM | POA: Diagnosis present

## 2016-05-09 DIAGNOSIS — L732 Hidradenitis suppurativa: Secondary | ICD-10-CM | POA: Diagnosis present

## 2016-05-09 DIAGNOSIS — I2699 Other pulmonary embolism without acute cor pulmonale: Secondary | ICD-10-CM | POA: Diagnosis not present

## 2016-05-09 DIAGNOSIS — E7800 Pure hypercholesterolemia, unspecified: Secondary | ICD-10-CM | POA: Diagnosis present

## 2016-05-09 DIAGNOSIS — J189 Pneumonia, unspecified organism: Secondary | ICD-10-CM | POA: Diagnosis not present

## 2016-05-09 DIAGNOSIS — J449 Chronic obstructive pulmonary disease, unspecified: Secondary | ICD-10-CM

## 2016-05-09 DIAGNOSIS — I82220 Acute embolism and thrombosis of inferior vena cava: Secondary | ICD-10-CM | POA: Diagnosis present

## 2016-05-09 DIAGNOSIS — C642 Malignant neoplasm of left kidney, except renal pelvis: Secondary | ICD-10-CM | POA: Diagnosis present

## 2016-05-09 DIAGNOSIS — D649 Anemia, unspecified: Secondary | ICD-10-CM | POA: Diagnosis present

## 2016-05-09 DIAGNOSIS — R05 Cough: Secondary | ICD-10-CM | POA: Diagnosis not present

## 2016-05-09 LAB — ABO/RH: ABO/RH(D): A POS

## 2016-05-09 LAB — COMPREHENSIVE METABOLIC PANEL
ALT: 11 U/L — ABNORMAL LOW (ref 17–63)
ANION GAP: 6 (ref 5–15)
AST: 14 U/L — ABNORMAL LOW (ref 15–41)
Albumin: 1.8 g/dL — ABNORMAL LOW (ref 3.5–5.0)
Alkaline Phosphatase: 145 U/L — ABNORMAL HIGH (ref 38–126)
BILIRUBIN TOTAL: 1 mg/dL (ref 0.3–1.2)
BUN: 18 mg/dL (ref 6–20)
CALCIUM: 9.2 mg/dL (ref 8.9–10.3)
CO2: 28 mmol/L (ref 22–32)
Chloride: 100 mmol/L — ABNORMAL LOW (ref 101–111)
Creatinine, Ser: 0.85 mg/dL (ref 0.61–1.24)
GFR calc Af Amer: >60 mL/min (ref >60)
Glucose, Bld: 89 mg/dL (ref 65–99)
POTASSIUM: 4.2 mmol/L (ref 3.5–5.1)
Sodium: 134 mmol/L — ABNORMAL LOW (ref 135–145)
TOTAL PROTEIN: 5.3 g/dL — AB (ref 6.5–8.1)

## 2016-05-09 LAB — MRSA PCR SCREENING: MRSA BY PCR: NEGATIVE

## 2016-05-09 LAB — TROPONIN I
TROPONIN I: 0.03 ng/mL (ref <0.031)
Troponin I: <0.03 ng/mL (ref <0.031)
Troponin I: 0.33 ng/mL — ABNORMAL HIGH (ref <0.031)

## 2016-05-09 LAB — HEPARIN LEVEL (UNFRACTIONATED): Heparin Unfractionated: <0.1 IU/mL — ABNORMAL LOW (ref 0.30–0.70)

## 2016-05-09 LAB — URINE CULTURE: Culture: <10000 — AB

## 2016-05-09 LAB — GLUCOSE, CAPILLARY
GLUCOSE-CAPILLARY: 92 mg/dL (ref 65–99)
Glucose-Capillary: 86 mg/dL (ref 65–99)

## 2016-05-09 LAB — TYPE AND SCREEN
ABO/RH(D): A POS
ANTIBODY SCREEN: NEGATIVE

## 2016-05-09 LAB — MAGNESIUM: MAGNESIUM: 1.7 mg/dL (ref 1.7–2.4)

## 2016-05-09 LAB — PROTIME-INR
INR: 1.17 (ref 0.00–1.49)
Prothrombin Time: 15.1 seconds (ref 11.6–15.2)

## 2016-05-09 LAB — PHOSPHORUS: Phosphorus: 3.4 mg/dL (ref 2.5–4.6)

## 2016-05-09 LAB — LIPASE, BLOOD: LIPASE: 18 U/L (ref 11–51)

## 2016-05-09 LAB — LACTIC ACID, PLASMA: Lactic Acid, Venous: 0.7 mmol/L (ref 0.5–2.0)

## 2016-05-09 MED ORDER — SODIUM CHLORIDE 0.9% FLUSH
10.0000 mL | Freq: Two times a day (BID) | INTRAVENOUS | Status: DC
  Administered 2016-05-09 – 2016-05-10 (×2): 10 mL

## 2016-05-09 MED ORDER — CETYLPYRIDINIUM CHLORIDE 0.05 % MT LIQD
7.0000 mL | Freq: Two times a day (BID) | OROMUCOSAL | Status: DC
  Administered 2016-05-09 – 2016-05-12 (×6): 7 mL via OROMUCOSAL

## 2016-05-09 MED ORDER — HEPARIN BOLUS VIA INFUSION
2000.0000 [IU] | Freq: Once | INTRAVENOUS | Status: AC
  Administered 2016-05-09: 2000 [IU] via INTRAVENOUS
  Filled 2016-05-09: qty 2000

## 2016-05-09 MED ORDER — MINOCYCLINE HCL 100 MG PO CAPS
100.0000 mg | ORAL_CAPSULE | Freq: Every day | ORAL | Status: DC
  Administered 2016-05-09 – 2016-05-12 (×4): 100 mg via ORAL
  Filled 2016-05-09 (×4): qty 1

## 2016-05-09 MED ORDER — HEPARIN (PORCINE) IN NACL 100-0.45 UNIT/ML-% IJ SOLN
2000.0000 [IU]/h | INTRAMUSCULAR | Status: DC
  Administered 2016-05-09: 1000 [IU]/h via INTRAVENOUS
  Administered 2016-05-09: 1250 [IU]/h via INTRAVENOUS
  Administered 2016-05-10: 1500 [IU]/h via INTRAVENOUS
  Administered 2016-05-11: 1650 [IU]/h via INTRAVENOUS
  Administered 2016-05-11: 2000 [IU]/h via INTRAVENOUS
  Filled 2016-05-09 (×5): qty 250

## 2016-05-09 MED ORDER — BOOST / RESOURCE BREEZE PO LIQD
1.0000 | Freq: Three times a day (TID) | ORAL | Status: DC
  Administered 2016-05-09 – 2016-05-12 (×10): 1 via ORAL

## 2016-05-09 MED ORDER — UMECLIDINIUM BROMIDE 62.5 MCG/INH IN AEPB
1.0000 | INHALATION_SPRAY | Freq: Every day | RESPIRATORY_TRACT | Status: DC
  Administered 2016-05-09 – 2016-05-12 (×4): 1 via RESPIRATORY_TRACT
  Filled 2016-05-09: qty 7

## 2016-05-09 MED ORDER — OXYCODONE HCL 5 MG PO TABS
15.0000 mg | ORAL_TABLET | ORAL | Status: DC | PRN
  Administered 2016-05-09 – 2016-05-12 (×15): 15 mg via ORAL
  Filled 2016-05-09 (×15): qty 3

## 2016-05-09 MED ORDER — CALCIUM CARBONATE ANTACID 500 MG PO CHEW
1.0000 | CHEWABLE_TABLET | Freq: Two times a day (BID) | ORAL | Status: AC
  Administered 2016-05-09 – 2016-05-10 (×4): 200 mg via ORAL
  Filled 2016-05-09 (×4): qty 1

## 2016-05-09 MED ORDER — SODIUM CHLORIDE 0.9% FLUSH
10.0000 mL | INTRAVENOUS | Status: DC | PRN
  Administered 2016-05-10: 20 mL
  Administered 2016-05-10: 10 mL
  Administered 2016-05-11: 20 mL
  Administered 2016-05-12: 10 mL
  Filled 2016-05-09 (×4): qty 40

## 2016-05-09 MED ORDER — AMPICILLIN-SULBACTAM SODIUM 3 (2-1) G IJ SOLR
3.0000 g | Freq: Four times a day (QID) | INTRAMUSCULAR | Status: AC
  Administered 2016-05-09 – 2016-05-11 (×11): 3 g via INTRAVENOUS
  Filled 2016-05-09 (×14): qty 3

## 2016-05-09 MED ORDER — DAPSONE 25 MG PO TABS
150.0000 mg | ORAL_TABLET | Freq: Every day | ORAL | Status: DC
  Administered 2016-05-09 – 2016-05-12 (×4): 150 mg via ORAL
  Filled 2016-05-09 (×4): qty 2

## 2016-05-09 MED ORDER — SODIUM CHLORIDE 0.9 % IV SOLN: 250.0000 mL | INTRAVENOUS | Status: DC | PRN

## 2016-05-09 MED ORDER — ALBUTEROL SULFATE (2.5 MG/3ML) 0.083% IN NEBU: 2.5000 mg | INHALATION_SOLUTION | Freq: Four times a day (QID) | RESPIRATORY_TRACT | Status: DC | PRN

## 2016-05-09 MED ORDER — UMECLIDINIUM-VILANTEROL 62.5-25 MCG/INH IN AEPB
1.0000 | INHALATION_SPRAY | Freq: Every day | RESPIRATORY_TRACT | Status: DC
  Filled 2016-05-09: qty 14

## 2016-05-09 MED ORDER — SODIUM CHLORIDE 0.9 % IV SOLN
INTRAVENOUS | Status: DC
  Administered 2016-05-09 – 2016-05-11 (×4): via INTRAVENOUS
  Administered 2016-05-12: 1000 mL via INTRAVENOUS

## 2016-05-09 MED ORDER — ARFORMOTEROL TARTRATE 15 MCG/2ML IN NEBU
15.0000 ug | INHALATION_SOLUTION | Freq: Two times a day (BID) | RESPIRATORY_TRACT | Status: DC
  Administered 2016-05-09 – 2016-05-12 (×7): 15 ug via RESPIRATORY_TRACT
  Filled 2016-05-09 (×8): qty 2

## 2016-05-09 MED ORDER — IOHEXOL 240 MG/ML SOLN: 150.0000 mL | Freq: Once | INTRAMUSCULAR | Status: DC

## 2016-05-09 NOTE — Progress Notes (Signed)
[place] for Heparin  Indication: pulmonary embolus  Allergies  Allergen Reactions  . Doxycycline Rash    Patient Measurements: Height: 6\' 4"  (193 cm) Weight: 156 lb 4.9 oz (70.9 kg) IBW/kg (Calculated) : 86.8  Vital Signs: Temp: 97.6 F (36.4 C) (06/10 1642) Temp Source: Oral (06/10 1642) BP: 93/57 mmHg (06/10 1642) Pulse Rate: 73 (06/10 1642)  Labs:  Recent Labs  05/08/16 1641 05/09/16 0140 05/09/16 0605 05/09/16 0700 05/09/16 1850  HGB 15.1  --   --   --   --   HCT 46.9  --   --   --   --   PLT 396  --   --   --   --   LABPROT 14.6 15.1  --   --   --   INR 1.12 1.17  --   --   --   HEPARINUNFRC  --   --  <0.10*  --  <0.10*  CREATININE 1.11 0.85  --   --   --   TROPONINI  --  0.03  --  <0.03 0.33*    Estimated Creatinine Clearance: 93.8 mL/min (by C-G formula based on Cr of 0.85).   Medical History: Past Medical History  Diagnosis Date  . Angina at rest [facility])   . Hypertension   . Diabetes mellitus without complication ([place])   . Coronary artery disease   . High cholesterol     Assessment: 60 y/o M transfer from [facility] with new onset PE, also found to have likely renal CA with metastatic diseases to the lungs. Heparin level is undetectable. No issues with the drug or line per RN. No overt bleeding noted.   Goal of Therapy:  Heparin level 0.3-0.7 units/ml Monitor platelets by anticoagulation protocol: Yes   Plan:  - Heparin 2000 units BOLUS then increase to 1250 units/hr - Check a 6 hour heparin level - Daily heparin level and CBC   Hatem Cull, Rande Lawman 05/09/2016,7:46 PM

## 2016-05-09 NOTE — Progress Notes (Signed)
Initial Nutrition Assessment  DOCUMENTATION CODES:   Severe malnutrition in context of acute illness/injury, Underweight  INTERVENTION:  - Will order Boost Breeze po TID, each supplement provides 250 kcal and 9 grams of protein. - Encourage PO intakes of meals and supplements. - RD will continue to monitor for additional needs, need to adjust supplements.   NUTRITION DIAGNOSIS:   Inadequate oral intake related to acute illness, nausea, vomiting, poor appetite as evidenced by per patient/family report.  GOAL:   Patient will meet greater than or equal to 90% of their needs  MONITOR:   PO intake, Supplement acceptance, Weight trends, Labs, Skin, I & O's  REASON FOR ASSESSMENT:   Malnutrition Screening Tool  ASSESSMENT:   60 year old man with a PMHx of COPD and CAD who presented to the ED with complaints of worsening dyspnea, weight loss, cough, hemoptysis, and abdominal pain. Apparently he has had some BRBPR as well. He presented to his PCP who treated him for pneumonia with levofloxacin. His symptoms persisted, and he presented to the ED at [facility], where he was found to have bilateral PEs, as well as what appeared to be a renal mass with metastatic disease to the lung. He was started on heparin and was transferred to [facility].  Pt seen for MST. BMI indicates underweight status. Per chart review, pt ate 0% of lunch today but visualized lunch tray with at least 50% completion. Pt reports that he is feeling better today and that abdominal pain and nausea were not as great after this meal as it has been in the past. Pt reports that for 4-6 weeks PTA he was having ongoing abdominal pain, intermittent bloody stools, and that for several days PTA he was experiencing extreme nausea and emesis with any PO intakes. He states that recently he has been "pecking like a bird" because all intakes, including his favorite beverage of coffee with creamer, has causing burning sensation as well as a sour  sensation in his stomach. Pt states that he has been taking 1-2 packets of Tums/day for several weeks to attempt to alleviate discomfort.  Pt states hx of COPD and that he sometimes experiences increased SOB with PO intakes. Noted poor dentition with several missing teeth; related to this and SOB with intakes at times pt does better with softer foods.  Pt states that weight has been fluctuating up and down for the past few months but that in the past few weeks he has steadily lost weight. Limited weight hx available in the chart. Pt reports that overall, he is down 30 lbs from 4 months ago. This indicates 16% body weight loss in this time frame which is significant. Physical assessment shows moderate and severe muscle wasting and moderate and severe fat wasting, no edema.  Will order Boost Breeze TID and adjust as needed. Not meeting needs. Medications reviewed. IVF: NS @ 100 mL/hr. Labs reviewed; Na: 134 mmol/L, Cl: 100 mmol/l, Alk Phos elevated.   Diet Order:  Diet regular Room service appropriate?: Yes; Fluid consistency:: Thin  Skin:  Reviewed, no issues  Last BM:  PTA  Height:   Ht Readings from Last 1 Encounters:  05/08/16 6' 4"  (1.93 m)    Weight:   Wt Readings from Last 1 Encounters:  05/09/16 156 lb 4.9 oz (70.9 kg)    Ideal Body Weight:  91.82 kg (kg)  BMI:  Body mass index is 19.03 kg/(m^2).  Estimated Nutritional Needs:   Kcal:  2125-2340 (30-33 kcal/kg)  Protein:  85-95 grams  Fluid:  >/= 2 L/day  EDUCATION NEEDS:   No education needs identified at this time     Jarome Matin, RD, [facility] Inpatient Clinical Dietitian Pager # [phone] After hours/weekend pager # [phone]

## 2016-05-09 NOTE — Progress Notes (Signed)
ANTICOAGULATION CONSULT NOTE - Initial Consult  Pharmacy Consult for Heparin  Indication: pulmonary embolus  Allergies  Allergen Reactions  . Doxycycline Rash    Patient Measurements: Height: 6\' 4"  (193 cm) Weight: 156 lb 4.9 oz (70.9 kg) IBW/kg (Calculated) : 86.8  Vital Signs: Temp: 97.8 F (36.6 C) (06/10 0338) Temp Source: Oral (06/10 0338) BP: 79/50 mmHg (06/10 0530) Pulse Rate: 62 (06/10 0530)  Labs:  Recent Labs  05/08/16 1641 05/09/16 0140 05/09/16 0605  HGB 15.1  --   --   HCT 46.9  --   --   PLT 396  --   --   LABPROT 14.6 15.1  --   INR 1.12 1.17  --   HEPARINUNFRC  --   --  <0.10*  CREATININE 1.11 0.85  --   TROPONINI  --  0.03  --     Estimated Creatinine Clearance: 93.8 mL/min (by C-G formula based on Cr of 0.85).   Medical History: Past Medical History  Diagnosis Date  . Angina at rest [facility])   . Hypertension   . Diabetes mellitus without complication ([place])   . Coronary artery disease   . High cholesterol     Assessment: 60 y/o M transfer from [facility] with new onset PE, also found to have likely renal CA with metastatic diseases to the lungs, pt having some blood in spit/per rectum but Hgb was good on admit.   Goal of Therapy:  Heparin level 0.3-0.7 units/ml Monitor platelets by anticoagulation protocol: Yes   Plan:  -Heparin 2000 units BOLUS given that Hgb is good -Increase heparin drip to 1000 units/hr -1400 HL -Monitor closely for any additional bleeding/trend Hgb  Narda Bonds 05/09/2016,6:57 AM

## 2016-05-09 NOTE — H&P (Addendum)
PULMONARY / CRITICAL CARE MEDICINE   Name: Eric Villarreal MRN: [id number] DOB: 1956-04-20    ADMISSION DATE:  05/08/2016  CHIEF COMPLAINT:  Hematochezia   HISTORY OF PRESENT ILLNESS:   [name] is a 60 year old man with a PMHx of COPD and CAD who presented to the ED with complaints of worsening dyspnea, weight loss, cough, hemoptysis, and abdominal pain. Apparently he has had some BRBPR as well. He presented to his PCP who treated him for pneumonia with levofloxacin. His symptoms persisted, and he presented to the ED at [facility], where he was found to have bilateral PEs, as well as what appeared to be a renal mass with metastatic disease to the lung. He was started on heparin and was transferred to [facility].  PAST MEDICAL HISTORY :  He  has a past medical history of Angina at rest [facility]); Hypertension; Diabetes mellitus without complication ([place]); Coronary artery disease; and High cholesterol.  PAST SURGICAL HISTORY: He  has past surgical history that includes Neck surgery and Back surgery.  Allergies  Allergen Reactions  . Doxycycline Rash    No current facility-administered medications on file prior to encounter.   No current outpatient prescriptions on file prior to encounter.    FAMILY HISTORY:  His has no family status information on file.   SOCIAL HISTORY: He  reports that he has been smoking.  He does not have any smokeless tobacco history on file. He reports that he does not drink alcohol.  REVIEW OF SYSTEMS:   Positive for wt loss, abdominal pain, anorexia, all other sx per HPI.  VITAL SIGNS: BP 81/57 mmHg  Pulse 80  Temp(Src) 97.8 F (36.6 C) (Oral)  Resp 23  Ht 6\' 4"  (1.93 m)  Wt 155 lb 3.3 oz (70.4 kg)  BMI 18.90 kg/m2  SpO2 91%  HEMODYNAMICS:    VENTILATOR SETTINGS:    INTAKE / OUTPUT: I/O last 3 completed shifts: In: 1000 [I.V.:1000] Out: -   PHYSICAL EXAMINATION: General:  Thin old man in NAD Neuro:  Appropriate HEENT:  Dry  MM Cardiovascular:  Normal Lungs:  Diffuse crackles Abdomen:  Moderately TTP diffusely. Normal BS. Musculoskeletal:  No swollen joints Skin:  No rashes.  LABS:  BMET  Recent Labs Lab 05/08/16 1641 05/09/16 0140  NA 130* 134*  K 4.3 4.2  CL 93* 100*  CO2 28 28  BUN 28* 18  CREATININE 1.11 0.85  GLUCOSE 101* 89    Electrolytes  Recent Labs Lab 05/08/16 1641 05/09/16 0140  CALCIUM 10.2 9.2  MG  --  1.7  PHOS  --  3.4    CBC  Recent Labs Lab 05/08/16 1641  WBC 16.1*  HGB 15.1  HCT 46.9  PLT 396    Coag's  Recent Labs Lab 05/08/16 1641 05/09/16 0140  INR 1.12 1.17    Sepsis Markers  Recent Labs Lab 05/08/16 1648 05/09/16 0140  LATICACIDVEN 2.00 0.7    ABG No results for input(s): PHART, PCO2ART, PO2ART in the last 168 hours.  Liver Enzymes  Recent Labs Lab 05/08/16 1641 05/09/16 0140  AST 17 14*  ALT 12* 11*  ALKPHOS 179* 145*  BILITOT 1.0 1.0  ALBUMIN 2.5* 1.8*    Cardiac Enzymes  Recent Labs Lab 05/09/16 0140  TROPONINI 0.03    Glucose  Recent Labs Lab 05/08/16 2216 05/09/16 0007  GLUCAP 127* 92    Imaging Ct Angio Chest Pe W/cm &/or Wo Cm  05/08/2016  CLINICAL DATA:  Spitting up  blood. Blood in stool. Weakness. Angina. EXAM: CT ANGIOGRAPHY CHEST CT ABDOMEN AND PELVIS WITH CONTRAST TECHNIQUE: Multidetector CT imaging of the chest was performed using the standard protocol during bolus administration of intravenous contrast. Multiplanar CT image reconstructions and MIPs were obtained to evaluate the vascular anatomy. Multidetector CT imaging of the abdomen and pelvis was performed using the standard protocol during bolus administration of intravenous contrast. CONTRAST:  100 mL of Isovue 370 COMPARISON:  None FINDINGS: CTA CHEST FINDINGS The central airways are normal. Evaluation of the lungs is somewhat limited due to respiratory motion. There is widespread infiltrate in the right upper lobe consistent with pneumonia. There  are innumerable nodules throughout both lungs consistent with metastatic disease. A representative nodule posteriorly in the right lung base on series 10, image 123 measures 2.7 x 2.1 cm. There is enlarged lymph node in the right hilum on series 8, image 81 measuring 3.9 by 3.4 cm. There is a prominent subcarinal node. Mildly prominent nodes seen in the mediastinum. The thoracic aorta measures 4.2 cm improved. No dissection. No pleural or pericardial effusions. There are coronary artery calcifications. The study is positive for pulmonary emboli most prominent in the right upper and right middle lobes. The embolus burden is relatively mild. No convincing evidence of heart strain. CT ABDOMEN and PELVIS FINDINGS No free air. The patient is cachectic and there is evidence of volume load with increased attenuation in the subcutaneous and abdominal fat. There is a large mass in the left kidney measuring 8.8 by 7.8 by 9.9 cm, centered in the lower pole. There is no extension of tumor into the peripheral left renal vein. There is extensive thrombus in the IVC, probably extending inferiorly into the iliac vessels. The thrombus extends to the level of the renal vein drainage. There is at least 1 small filling defect in the central left renal vein but there is no definitive connection to the left renal mass. The defect in the left renal vein is best seen on coronal image 59. The right kidney is normal. The intrahepatic portions of the portal vein are patent. There does appear to be thrombus within the confluence of the splenic and superior mesenteric veins best seen on coronal image 43. The attenuation of the liver is somewhat heterogeneous but no discrete metastases are seen. The gallbladder is unremarkable. There is a mass in the medial spleen on axial image 18. While nonspecific, a metastatic lesion is possible measuring up to 2.6 cm. The right adrenal gland and pancreas are normal. No discrete nodules seen in the left  adrenal gland. There are multiple abnormal lymph nodes in the left side of the retroperitoneum between the kidney in the aorta. A representative node to the left of the aorta on axial image 31 and measures 2.6 by 2.1 cm. The abdominal aorta is normal in caliber. The stomach is poorly evaluated but grossly unremarkable. The small bowel is nonobstructed. The colon is normal. The appendix not visualized but there is no evidence of appendicitis. The pelvis demonstrates probable extension of thrombus into the iliac vessels. Pelvic loops of bowel are unremarkable. The bladder is thick-walled but otherwise unremarkable. No discrete adenopathy. No filling defects in the renal collecting systems on delayed images. No bony metastatic disease. Review of the MIP images confirms the above findings. IMPRESSION: 1. The study is positive for pulmonary emboli. There is extensive thrombus in the IVC extending inferiorly into the iliac veins. There is a small amount of thrombus in the left renal vein  as well as a small amount of thrombus in the splenic and superior mesenteric veins. 2. 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 A mass in the spleen is nonspecific but could represent a metastatic lesion. 3. Right upper lobe pneumonia. 4. 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 Findings called to Dr. Noemi Chapel Electronically Signed   By: Dorise Bullion III M.D   On: 05/08/2016 19:35   Ct Abdomen Pelvis W Contrast  05/08/2016  CLINICAL DATA:  Spitting up blood. Blood in stool. Weakness. Angina. EXAM: CT ANGIOGRAPHY CHEST CT ABDOMEN AND PELVIS WITH CONTRAST TECHNIQUE: Multidetector CT imaging of the chest was performed using the standard protocol during bolus administration of intravenous contrast. Multiplanar CT image reconstructions and MIPs were obtained to evaluate the vascular anatomy. Multidetector CT imaging of the abdomen and pelvis  was performed using the standard protocol during bolus administration of intravenous contrast. CONTRAST:  100 mL of Isovue 370 COMPARISON:  None FINDINGS: CTA CHEST FINDINGS The central airways are normal. Evaluation of the lungs is somewhat limited due to respiratory motion. There is widespread infiltrate in the right upper lobe consistent with pneumonia. There are innumerable nodules throughout both lungs consistent with metastatic disease. A representative nodule posteriorly in the right lung base on series 10, image 123 measures 2.7 x 2.1 cm. There is enlarged lymph node in the right hilum on series 8, image 81 measuring 3.9 by 3.4 cm. There is a prominent subcarinal node. Mildly prominent nodes seen in the mediastinum. The thoracic aorta measures 4.2 cm improved. No dissection. No pleural or pericardial effusions. There are coronary artery calcifications. The study is positive for pulmonary emboli most prominent in the right upper and right middle lobes. The embolus burden is relatively mild. No convincing evidence of heart strain. CT ABDOMEN and PELVIS FINDINGS No free air. The patient is cachectic and there is evidence of volume load with increased attenuation in the subcutaneous and abdominal fat. There is a large mass in the left kidney measuring 8.8 by 7.8 by 9.9 cm, centered in the lower pole. There is no extension of tumor into the peripheral left renal vein. There is extensive thrombus in the IVC, probably extending inferiorly into the iliac vessels. The thrombus extends to the level of the renal vein drainage. There is at least 1 small filling defect in the central left renal vein but there is no definitive connection to the left renal mass. The defect in the left renal vein is best seen on coronal image 59. The right kidney is normal. The intrahepatic portions of the portal vein are patent. There does appear to be thrombus within the confluence of the splenic and superior mesenteric veins best seen on  coronal image 43. The attenuation of the liver is somewhat heterogeneous but no discrete metastases are seen. The gallbladder is unremarkable. There is a mass in the medial spleen on axial image 18. While nonspecific, a metastatic lesion is possible measuring up to 2.6 cm. The right adrenal gland and pancreas are normal. No discrete nodules seen in the left adrenal gland. There are multiple abnormal lymph nodes in the left side of the retroperitoneum between the kidney in the aorta. A representative node to the left of the aorta on axial image 31 and measures 2.6 by 2.1 cm. The abdominal aorta is normal in caliber. The stomach is poorly evaluated but grossly unremarkable. The small bowel is nonobstructed. The colon is normal.  The appendix not visualized but there is no evidence of appendicitis. The pelvis demonstrates probable extension of thrombus into the iliac vessels. Pelvic loops of bowel are unremarkable. The bladder is thick-walled but otherwise unremarkable. No discrete adenopathy. No filling defects in the renal collecting systems on delayed images. No bony metastatic disease. Review of the MIP images confirms the above findings. IMPRESSION: 1. The study is positive for pulmonary emboli. There is extensive thrombus in the IVC extending inferiorly into the iliac veins. There is a small amount of thrombus in the left renal vein as well as a small amount of thrombus in the splenic and superior mesenteric veins. 2. 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 A mass in the spleen is nonspecific but could represent a metastatic lesion. 3. Right upper lobe pneumonia. 4. 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 Findings called to Dr. Noemi Chapel Electronically Signed   By: Dorise Bullion III M.D   On: 05/08/2016 19:35   Dg Chest Portable 1 View  05/08/2016  CLINICAL DATA:  60 year old male with central line placement.  Renal cell carcinoma with metastasis to the lung. EXAM: PORTABLE CHEST 1 VIEW COMPARISON:  Chest CT dated 05/09/2016 FINDINGS: Right IJ central line with tip over central SVC. There is no pneumothorax. There is emphysematous changes of lungs. A patchy area of airspace opacity in the right upper lobe as seen on the prior study most likely pneumonia. There are multiple scattered pulmonary nodules as seen on the prior CT most compatible with metastatic disease. There is no pleural effusion. The cardiac silhouette is within normal limits. No acute osseous pathology. IMPRESSION: Right IJ central line with tip over central SVC.  No pneumothorax. Emphysema with right upper lobe infiltrate as well as multiple pulmonary nodules/metastatic disease. These findings are better seen on the earlier CT. Electronically Signed   By: Anner Crete M.D.   On: 05/08/2016 22:16     STUDIES:  >>CT Chest/Abd 6/9: concerning for renal CA with mets to lung. Bilateral PEs.  CULTURES: None  ANTIBIOTICS: None  SIGNIFICANT EVENTS: None  LINES/TUBES: CVC in R IJ  DISCUSSION: 60 y/o man with what appears to be stage IV RCC presenting with dyspena, PE and possible PRBPR. Will see how he tolerates heparin gtt, and if he warrants endoscopy.  ASSESSMENT / PLAN:  PULMONARY A: Bilateral PEs COPD Infiltrate P:   Heparin gtt; can reverse and address issue of develops significant hemorrhage better than can treat for major CV response to PEs. COPD not presently exacerbated. Will treat with Unasyn.  CARDIOVASCULAR A:  CAD P:  Not an active issue.  RENAL A:   Renal mass Hyponatremia P:   Possibly related to poor PO intake or malignancy.  GASTROINTESTINAL A:   Weight loss Hypoalbuminemia P:   Likely due to malignancy, need tissue conformation.  HEMATOLOGIC A:   Renal mass with lung nodules P:  Will discuss with onc best way to make dx.  INFECTIOUS A:   Pneumonia P:   Tx with Unasyn as  above.  ENDOCRINE A:   No active issues   P:    NEUROLOGIC A:   Pain P:   RASS goal: 0 tx with PRN with oxycodone    FAMILY  - Updates:   - Inter-disciplinary family meet or Palliative Care meeting due by:  6/17  CRITICAL CARE: The patient is critically ill with multiple organ systems failure and requires high complexity  decision making for assessment and support, frequent evaluation and titration of therapies, application of advanced monitoring technologies and extensive interpretation of multiple databases. Critical Care Time devoted to patient care services described in this note is 45 minutes.    Luz Brazen, MD Pulmonary and [place] Pager: [phone]  05/09/2016, 3:57 AM

## 2016-05-09 NOTE — Progress Notes (Signed)
PULMONARY / CRITICAL CARE MEDICINE   Name: Eric Villarreal MRN: [id number] DOB: June 25, 1956    ADMISSION DATE:  05/08/2016  CHIEF COMPLAINT:  Hematochezia   BRIEF:   60 y/o male with COPD and CAD who presented to the ER with cough, hemoptysis and abdominal pain > found to have evidence of metatstatic RCC and a pulmonary embolism.   VITAL SIGNS: BP 89/61 mmHg  Pulse 76  Temp(Src) 97.5 F (36.4 C) (Axillary)  Resp 22  Ht 6\' 4"  (1.93 m)  Wt 70.9 kg (156 lb 4.9 oz)  BMI 19.03 kg/m2  SpO2 97%  HEMODYNAMICS:    VENTILATOR SETTINGS:    INTAKE / OUTPUT: I/O last 3 completed shifts: In: 2087.9 [I.V.:1987.9; IV Piggyback:100] Out: 600 [Urine:600]  PHYSICAL EXAMINATION: General:  Thin, no distress in bed Neuro:  Awake alert no distress HEENT:  MMM, NCAT Cardiovascular:  RRR, no mgr Lungs:  Few crackles, normal effort Abdomen:  Soft, non tender for my exam, belly soft Musculoskeletal:  No bony abrnomalities Skin:  No rashes or skin breakdown  LABS:  BMET  Recent Labs Lab 05/08/16 1641 05/09/16 0140  NA 130* 134*  K 4.3 4.2  CL 93* 100*  CO2 28 28  BUN 28* 18  CREATININE 1.11 0.85  GLUCOSE 101* 89    Electrolytes  Recent Labs Lab 05/08/16 1641 05/09/16 0140  CALCIUM 10.2 9.2  MG  --  1.7  PHOS  --  3.4    CBC  Recent Labs Lab 05/08/16 1641  WBC 16.1*  HGB 15.1  HCT 46.9  PLT 396    Coag's  Recent Labs Lab 05/08/16 1641 05/09/16 0140  INR 1.12 1.17    Sepsis Markers  Recent Labs Lab 05/08/16 1648 05/09/16 0140  LATICACIDVEN 2.00 0.7    ABG No results for input(s): PHART, PCO2ART, PO2ART in the last 168 hours.  Liver Enzymes  Recent Labs Lab 05/08/16 1641 05/09/16 0140  AST 17 14*  ALT 12* 11*  ALKPHOS 179* 145*  BILITOT 1.0 1.0  ALBUMIN 2.5* 1.8*    Cardiac Enzymes  Recent Labs Lab 05/09/16 0140 05/09/16 0700  TROPONINI 0.03 <0.03    Glucose  Recent Labs Lab 05/08/16 2216 05/09/16 0007 05/09/16 0336   GLUCAP 127* 92 86    Imaging   STUDIES:  >>CT Chest/Abd 6/9: concerning for renal CA with mets to lung. Bilateral PEs.  CULTURES: None  ANTIBIOTICS: None  SIGNIFICANT EVENTS: None  LINES/TUBES: CVC in R IJ >   DISCUSSION: 60 y/o man with what appears to be stage IV RCC presenting with dyspena, PE.  ? Bright red blood per rectum.  Hemodynamically stable and asymptomatic on 6/10.    ASSESSMENT / PLAN:  PULMONARY A: Bilateral PEs > HD stable COPD not in exacerbation Infiltrate > aspiration pneumonia? P:   Continue heparin for now Unclear if he will need a biopsy for possible RCC, will maintain for now Continue brovana and incruse for now  CARDIOVASCULAR A:  CAD P:  tele  RENAL A:   Renal mass Hyponatremia P:   Monitor BMET and UOP Replace electrolytes as needed Oncology consult  GASTROINTESTINAL A:   Weight loss Hypoalbuminemia P:   Likely due to malignancy, need tissue conformation Advance diet  HEMATOLOGIC A:   Renal mass with lung nodules P:  Will discuss with onc best way to make dx  INFECTIOUS A:   Pneumonia P:   Tx with Unasyn as above  ENDOCRINE A:   No active issues  P:    NEUROLOGIC A:   Pain P:   tx with PRN with oxycodone    FAMILY  - Updates: none bedside  - Inter-disciplinary family meet or Palliative Care meeting due by:  6/17  CRITICAL CARE: The patient is critically ill with multiple organ systems failure and requires high complexity decision making for assessment and support, frequent evaluation and titration of therapies, application of advanced monitoring technologies and extensive interpretation of multiple databases. Critical Care Time devoted to patient care services described in this note is 35 minutes.    Roselie Awkward, MD [place] [facility] Pager: [phone] Cell: [phone] After 3pm or if no response, call [phone]   05/09/2016, 8:57 AM

## 2016-05-09 NOTE — Consult Note (Signed)
[place]  [phone]   HEMATOLOGY ONCOLOGY INPATIENT CONSULTATION   JEVION KINNETT  DOB: 1956/07/31  MR#: [id number]  CSN#: [id number]    Requesting Physician: MICU Dr. Waunita Schooner   Patient Care Team: Lucia Gaskins, MD as PCP - General (Internal Medicine)  Reason for consult: Kidney and lung mass  History of present illness:   Patient is a 60 year old Caucasian male with past medical history of COPD and coronary artery disease who presented with worsening dyspnea, weight loss, hemoptysis and abdominal pain ,was transferred from [facility] to [facility] MICU for bilateral PEs. His workup including CT scans reviewed a large left kidney mass, and a multiple bilateral lung lesions, highly suspicious for metastasis. He is hemodynamically stable, on heparin for PE.   MEDICAL HISTORY:  Past Medical History  Diagnosis Date  . Angina at rest [facility])   . Hypertension   . Diabetes mellitus without complication ([place])   . Coronary artery disease   . High cholesterol     SURGICAL HISTORY: Past Surgical History  Procedure Laterality Date  . Neck surgery    . Back surgery      SOCIAL HISTORY: Social History   Social History  . Marital Status: Single    Spouse Name: N/A  . Number of Children: N/A  . Years of Education: N/A   Occupational History  . Not on file.   Social History Main Topics  . Smoking status: Current Every Day Smoker -- 2.00 packs/day  . Smokeless tobacco: Not on file  . Alcohol Use: No  . Drug Use: Not on file  . Sexual Activity: Not on file   Other Topics Concern  . Not on file   Social History Narrative    FAMILY HISTORY: History reviewed. No pertinent family history.  ALLERGIES:  is allergic to doxycycline.  MEDICATIONS:  Current Facility-Administered Medications  Medication Dose Route Frequency Provider Last Rate Last Dose  . 0.9 %  sodium chloride infusion  250 mL Intravenous PRN Luz Brazen, MD      . 0.9 %   sodium chloride infusion   Intravenous Continuous Luz Brazen, MD 100 mL/hr at 05/09/16 0600    . albuterol (PROVENTIL) (2.5 MG/3ML) 0.083% nebulizer solution 2.5 mg  2.5 mg Inhalation Q6H PRN Luz Brazen, MD      . Ampicillin-Sulbactam (UNASYN) 3 g in sodium chloride 0.9 % 100 mL IVPB  3 g Intravenous Q6H Luz Brazen, MD   3 g at 05/09/16 [id number]  . antiseptic oral rinse (CPC / CETYLPYRIDINIUM CHLORIDE 0.05%) solution 7 mL  7 mL Mouth Rinse BID Luz Brazen, MD      . umeclidinium bromide (INCRUSE ELLIPTA) 62.5 MCG/INH 1 puff  1 puff Inhalation Daily Luz Brazen, MD       And  . arformoterol [facility]) nebulizer solution 15 mcg  15 mcg Nebulization BID Luz Brazen, MD      . dapsone tablet 150 mg  150 mg Oral Daily Luz Brazen, MD      . heparin ADULT infusion 100 units/mL (25000 units/288mL sodium chloride 0.45%)  1,000 Units/hr Intravenous Continuous Erenest Blank, RPH 10 mL/hr at 05/09/16 0722 1,000 Units/hr at 05/09/16 [id number]  . minocycline (MINOCIN,DYNACIN) capsule 100 mg  100 mg Oral Daily Luz Brazen, MD      . oxyCODONE (Oxy IR/ROXICODONE) immediate release tablet 15 mg  15 mg Oral Q4H PRN Luz Brazen, MD   15 mg at 05/09/16 [id number]    REVIEW OF SYSTEMS:  Constitutional: Denies fevers, chills or abnormal night sweats Eyes: Denies blurriness of vision, double vision or watery eyes Ears, nose, mouth, throat, and face: Denies mucositis or sore throat Respiratory: Denies cough, dyspnea or wheezes Cardiovascular: Denies palpitation, chest discomfort or lower extremity swelling Gastrointestinal:  Denies nausea, heartburn or change in bowel habits Skin: Denies abnormal skin rashes Lymphatics: Denies new lymphadenopathy or easy bruising Neurological:Denies numbness, tingling or new weaknesses Behavioral/Psych: Mood is stable, no new changes  All other systems were reviewed with the patient and are negative.  PHYSICAL EXAMINATION: ECOG PERFORMANCE STATUS: 2 - Symptomatic, <50%  confined to bed  Filed Vitals:   05/09/16 0753 05/09/16 0800  BP:  89/61  Pulse:  76  Temp: 97.5 F (36.4 C)   Resp:  22   Filed Weights   05/08/16 1605 05/08/16 2330 05/09/16 0500  Weight: 151 lb (68.493 kg) 155 lb 3.3 oz (70.4 kg) 156 lb 4.9 oz (70.9 kg)    GENERAL:alert, no distress and comfortable, on [place] oxygen  SKIN: skin color, texture, turgor are normal, no rashes or significant lesions EYES: normal, conjunctiva are pink and non-injected, sclera clear OROPHARYNX:no exudate, no erythema and lips, buccal mucosa, and tongue normal  NECK: supple, thyroid normal size, non-tender, without nodularity LYMPH:  no palpable lymphadenopathy in the cervical, axillary or inguinal LUNGS: (+) diffuse crackles  HEART: regular rate & rhythm and no murmurs and no lower extremity edema ABDOMEN:abdomen soft, non-tender and normal bowel sounds Musculoskeletal:no cyanosis of digits and no clubbing  PSYCH: alert & oriented x 3 with fluent speech NEURO: no focal motor/sensory deficits  LABORATORY DATA:  I have reviewed the data as listed Lab Results  Component Value Date   WBC 16.1* 05/08/2016   HGB 15.1 05/08/2016   HCT 46.9 05/08/2016   MCV 79.6 05/08/2016   PLT 396 05/08/2016    Recent Labs  05/08/16 1641 05/09/16 0140  NA 130* 134*  K 4.3 4.2  CL 93* 100*  CO2 28 28  GLUCOSE 101* 89  BUN 28* 18  CREATININE 1.11 0.85  CALCIUM 10.2 9.2  GFRNONAA >60 >60  GFRAA >60 >60  PROT 7.3 5.3*  ALBUMIN 2.5* 1.8*  AST 17 14*  ALT 12* 11*  ALKPHOS 179* 145*  BILITOT 1.0 1.0    RADIOGRAPHIC STUDIES: I have personally reviewed the radiological images as listed and agreed with the findings in the report. Dg Chest 2 View  05/04/2016  CLINICAL DATA:  Cough hemoptysis EXAM: CHEST  2 VIEW COMPARISON:  None. FINDINGS: Cardiac shadow is within normal limits. The lungs are hyperinflated. Increased density is noted in the right upper lobe consistent with acute infiltrate. Multiple pulmonary  nodules are noted bilaterally consistent with metastatic disease. Further evaluation by means of CT of the chest with contrast is recommended. IMPRESSION: COPD. Right upper lobe infiltrate. Multiple pulmonary nodules consistent with metastatic disease. CT of the chest is recommended for further evaluation. These results will be called to the ordering clinician or representative by the Radiologist Assistant, and communication documented in the PACS or zVision Dashboard. Electronically Signed   By: Inez Catalina M.D.   On: 05/04/2016 22:10   Ct Angio Chest Pe W/cm &/or Wo Cm  05/08/2016  CLINICAL DATA:  Spitting up blood. Blood in stool. Weakness. Angina. EXAM: CT ANGIOGRAPHY CHEST CT ABDOMEN AND PELVIS WITH CONTRAST TECHNIQUE: Multidetector CT imaging of the chest was performed using the standard protocol during bolus administration of intravenous contrast. Multiplanar CT image reconstructions and MIPs were  obtained to evaluate the vascular anatomy. Multidetector CT imaging of the abdomen and pelvis was performed using the standard protocol during bolus administration of intravenous contrast. CONTRAST:  100 mL of Isovue 370 COMPARISON:  None FINDINGS: CTA CHEST FINDINGS The central airways are normal. Evaluation of the lungs is somewhat limited due to respiratory motion. There is widespread infiltrate in the right upper lobe consistent with pneumonia. There are innumerable nodules throughout both lungs consistent with metastatic disease. A representative nodule posteriorly in the right lung base on series 10, image 123 measures 2.7 x 2.1 cm. There is enlarged lymph node in the right hilum on series 8, image 81 measuring 3.9 by 3.4 cm. There is a prominent subcarinal node. Mildly prominent nodes seen in the mediastinum. The thoracic aorta measures 4.2 cm improved. No dissection. No pleural or pericardial effusions. There are coronary artery calcifications. The study is positive for pulmonary emboli most prominent in  the right upper and right middle lobes. The embolus burden is relatively mild. No convincing evidence of heart strain. CT ABDOMEN and PELVIS FINDINGS No free air. The patient is cachectic and there is evidence of volume load with increased attenuation in the subcutaneous and abdominal fat. There is a large mass in the left kidney measuring 8.8 by 7.8 by 9.9 cm, centered in the lower pole. There is no extension of tumor into the peripheral left renal vein. There is extensive thrombus in the IVC, probably extending inferiorly into the iliac vessels. The thrombus extends to the level of the renal vein drainage. There is at least 1 small filling defect in the central left renal vein but there is no definitive connection to the left renal mass. The defect in the left renal vein is best seen on coronal image 59. The right kidney is normal. The intrahepatic portions of the portal vein are patent. There does appear to be thrombus within the confluence of the splenic and superior mesenteric veins best seen on coronal image 43. The attenuation of the liver is somewhat heterogeneous but no discrete metastases are seen. The gallbladder is unremarkable. There is a mass in the medial spleen on axial image 18. While nonspecific, a metastatic lesion is possible measuring up to 2.6 cm. The right adrenal gland and pancreas are normal. No discrete nodules seen in the left adrenal gland. There are multiple abnormal lymph nodes in the left side of the retroperitoneum between the kidney in the aorta. A representative node to the left of the aorta on axial image 31 and measures 2.6 by 2.1 cm. The abdominal aorta is normal in caliber. The stomach is poorly evaluated but grossly unremarkable. The small bowel is nonobstructed. The colon is normal. The appendix not visualized but there is no evidence of appendicitis. The pelvis demonstrates probable extension of thrombus into the iliac vessels. Pelvic loops of bowel are unremarkable. The  bladder is thick-walled but otherwise unremarkable. No discrete adenopathy. No filling defects in the renal collecting systems on delayed images. No bony metastatic disease. Review of the MIP images confirms the above findings. IMPRESSION: 1. The study is positive for pulmonary emboli. There is extensive thrombus in the IVC extending inferiorly into the iliac veins. There is a small amount of thrombus in the left renal vein as well as a small amount of thrombus in the splenic and superior mesenteric veins. 2. 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 A mass in the spleen is nonspecific but  could represent a metastatic lesion. 3. Right upper lobe pneumonia. 4. 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 Findings called to Dr. Noemi Chapel Electronically Signed   By: Dorise Bullion III M.D   On: 05/08/2016 19:35   Ct Abdomen Pelvis W Contrast  05/08/2016  CLINICAL DATA:  Spitting up blood. Blood in stool. Weakness. Angina. EXAM: CT ANGIOGRAPHY CHEST CT ABDOMEN AND PELVIS WITH CONTRAST TECHNIQUE: Multidetector CT imaging of the chest was performed using the standard protocol during bolus administration of intravenous contrast. Multiplanar CT image reconstructions and MIPs were obtained to evaluate the vascular anatomy. Multidetector CT imaging of the abdomen and pelvis was performed using the standard protocol during bolus administration of intravenous contrast. CONTRAST:  100 mL of Isovue 370 COMPARISON:  None FINDINGS: CTA CHEST FINDINGS The central airways are normal. Evaluation of the lungs is somewhat limited due to respiratory motion. There is widespread infiltrate in the right upper lobe consistent with pneumonia. There are innumerable nodules throughout both lungs consistent with metastatic disease. A representative nodule posteriorly in the right lung base on series 10, image 123 measures 2.7 x 2.1 cm. There is  enlarged lymph node in the right hilum on series 8, image 81 measuring 3.9 by 3.4 cm. There is a prominent subcarinal node. Mildly prominent nodes seen in the mediastinum. The thoracic aorta measures 4.2 cm improved. No dissection. No pleural or pericardial effusions. There are coronary artery calcifications. The study is positive for pulmonary emboli most prominent in the right upper and right middle lobes. The embolus burden is relatively mild. No convincing evidence of heart strain. CT ABDOMEN and PELVIS FINDINGS No free air. The patient is cachectic and there is evidence of volume load with increased attenuation in the subcutaneous and abdominal fat. There is a large mass in the left kidney measuring 8.8 by 7.8 by 9.9 cm, centered in the lower pole. There is no extension of tumor into the peripheral left renal vein. There is extensive thrombus in the IVC, probably extending inferiorly into the iliac vessels. The thrombus extends to the level of the renal vein drainage. There is at least 1 small filling defect in the central left renal vein but there is no definitive connection to the left renal mass. The defect in the left renal vein is best seen on coronal image 59. The right kidney is normal. The intrahepatic portions of the portal vein are patent. There does appear to be thrombus within the confluence of the splenic and superior mesenteric veins best seen on coronal image 43. The attenuation of the liver is somewhat heterogeneous but no discrete metastases are seen. The gallbladder is unremarkable. There is a mass in the medial spleen on axial image 18. While nonspecific, a metastatic lesion is possible measuring up to 2.6 cm. The right adrenal gland and pancreas are normal. No discrete nodules seen in the left adrenal gland. There are multiple abnormal lymph nodes in the left side of the retroperitoneum between the kidney in the aorta. A representative node to the left of the aorta on axial image 31 and  measures 2.6 by 2.1 cm. The abdominal aorta is normal in caliber. The stomach is poorly evaluated but grossly unremarkable. The small bowel is nonobstructed. The colon is normal. The appendix not visualized but there is no evidence of appendicitis. The pelvis demonstrates probable extension of thrombus into the iliac vessels. Pelvic loops of bowel are unremarkable. The bladder is thick-walled but otherwise unremarkable. No discrete adenopathy. No filling defects in  the renal collecting systems on delayed images. No bony metastatic disease. Review of the MIP images confirms the above findings. IMPRESSION: 1. The study is positive for pulmonary emboli. There is extensive thrombus in the IVC extending inferiorly into the iliac veins. There is a small amount of thrombus in the left renal vein as well as a small amount of thrombus in the splenic and superior mesenteric veins. 2. 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 A mass in the spleen is nonspecific but could represent a metastatic lesion. 3. Right upper lobe pneumonia. 4. 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 Findings called to Dr. Noemi Chapel Electronically Signed   By: Dorise Bullion III M.D   On: 05/08/2016 19:35   Dg Chest Portable 1 View  05/08/2016  CLINICAL DATA:  60 year old male with central line placement. Renal cell carcinoma with metastasis to the lung. EXAM: PORTABLE CHEST 1 VIEW COMPARISON:  Chest CT dated 05/09/2016 FINDINGS: Right IJ central line with tip over central SVC. There is no pneumothorax. There is emphysematous changes of lungs. A patchy area of airspace opacity in the right upper lobe as seen on the prior study most likely pneumonia. There are multiple scattered pulmonary nodules as seen on the prior CT most compatible with metastatic disease. There is no pleural effusion. The cardiac silhouette is within normal limits. No acute osseous  pathology. IMPRESSION: Right IJ central line with tip over central SVC.  No pneumothorax. Emphysema with right upper lobe infiltrate as well as multiple pulmonary nodules/metastatic disease. These findings are better seen on the earlier CT. Electronically Signed   By: Anner Crete M.D.   On: 05/08/2016 22:16    ASSESSMENT & PLAN: 60 yo male with PMH of COPD and CAD, presented with cough, hemoptysis, worsening dysp and abdominal pain. CT scan showed a large left kidney mass and bilateral lung metastasis, and bilateral PEs.  1. Large left renal mass and diffuse bilateral lung nodules, thoracic and abdominal adenopathy, highly suspicious for metastatic RCC 2. Extensive IVC thrombosis, extending into the iliac vein an left renal vein, right upper and middle  Pulmonary embolism 3. RUL pneumonia 4. COPD 5. CAD   Recommendations: -I reviewed his CT scan findings, which is highly suspicious for metastatic malignancy, especially renal cell carcinoma. -I recommend a tissue biopsy to confirm the diagnosis. He has extensive thrombosis in IVC, but the thrombus burden in the lung is mild, possible from IVC. If primary team feels it's safe to hold hepatin for 6-8 hours, please consult IR for CT-guided lung mass biopsy. -resume anticoagulation after biopsy, he will likely need anticoagulation indefinitely, if no bleeding contraindications, with Lovenox or Coumadin. -agree with antibiotics for his pneumonia -I'll set up his outpatient follow up with [place], likely see my partner Dr. Alen Blew in the clinic.   Thanks for the opportunity to participate the patient's care, please call me if you have questions.  All questions were answered. The patient knows to call the clinic with any problems, questions or concerns.    Truitt Merle, MD 05/09/2016 9:22 AM

## 2016-05-10 ENCOUNTER — Inpatient Hospital Stay (HOSPITAL_COMMUNITY): Payer: Medicaid - Out of State

## 2016-05-10 DIAGNOSIS — E43 Unspecified severe protein-calorie malnutrition: Secondary | ICD-10-CM

## 2016-05-10 DIAGNOSIS — I2699 Other pulmonary embolism without acute cor pulmonale: Secondary | ICD-10-CM | POA: Insufficient documentation

## 2016-05-10 DIAGNOSIS — J9601 Acute respiratory failure with hypoxia: Secondary | ICD-10-CM

## 2016-05-10 LAB — CBC
HCT: 37.8 % — ABNORMAL LOW (ref 39.0–52.0)
Hemoglobin: 11.2 g/dL — ABNORMAL LOW (ref 13.0–17.0)
MCH: 23.7 pg — AB (ref 26.0–34.0)
MCHC: 29.6 g/dL — ABNORMAL LOW (ref 30.0–36.0)
MCV: 80.1 fL (ref 78.0–100.0)
PLATELETS: 413 10*3/uL — AB (ref 150–400)
RBC: 4.72 MIL/uL (ref 4.22–5.81)
RDW: 19 % — AB (ref 11.5–15.5)
WBC: 11.9 10*3/uL — AB (ref 4.0–10.5)

## 2016-05-10 LAB — BASIC METABOLIC PANEL
ANION GAP: 6 (ref 5–15)
BUN: 13 mg/dL (ref 6–20)
CHLORIDE: 100 mmol/L — AB (ref 101–111)
CO2: 28 mmol/L (ref 22–32)
Calcium: 9.2 mg/dL (ref 8.9–10.3)
Creatinine, Ser: 0.82 mg/dL (ref 0.61–1.24)
GFR calc non Af Amer: >60 mL/min (ref >60)
Glucose, Bld: 123 mg/dL — ABNORMAL HIGH (ref 65–99)
Potassium: 4.3 mmol/L (ref 3.5–5.1)
SODIUM: 134 mmol/L — AB (ref 135–145)

## 2016-05-10 LAB — HEPARIN LEVEL (UNFRACTIONATED)
HEPARIN UNFRACTIONATED: 0.3 [IU]/mL (ref 0.30–0.70)
Heparin Unfractionated: <0.1 IU/mL — ABNORMAL LOW (ref 0.30–0.70)

## 2016-05-10 MED ORDER — HEPARIN BOLUS VIA INFUSION
2000.0000 [IU] | Freq: Once | INTRAVENOUS | Status: AC
  Administered 2016-05-10: 2000 [IU] via INTRAVENOUS
  Filled 2016-05-10: qty 2000

## 2016-05-10 NOTE — Progress Notes (Signed)
[place] for Heparin  Indication: pulmonary embolus  Allergies  Allergen Reactions  . Doxycycline Rash   Patient Measurements: Height: 6\' 4"  (193 cm) Weight: 162 lb 11.2 oz (73.8 kg) IBW/kg (Calculated) : 86.8  Vital Signs: Temp: 98.2 F (36.8 C) (06/11 0852) Temp Source: Oral (06/11 0852) BP: 94/54 mmHg (06/11 0852) Pulse Rate: 80 (06/11 0852)  Labs:  Recent Labs  05/08/16 1641 05/09/16 0140  05/09/16 0700 05/09/16 1850 05/10/16 0310 05/10/16 0325 05/10/16 1250  HGB 15.1  --   --   --   --   --  11.2*  --   HCT 46.9  --   --   --   --   --  37.8*  --   PLT 396  --   --   --   --   --  413*  --   LABPROT 14.6 15.1  --   --   --   --   --   --   INR 1.12 1.17  --   --   --   --   --   --   HEPARINUNFRC  --   --   < >  --  <0.10*  --  <0.10* 0.30  CREATININE 1.11 0.85  --   --   --  0.82  --   --   TROPONINI  --  0.03  --  <0.03 0.33*  --   --   --   < > = values in this interval not displayed.  Estimated Creatinine Clearance: 101.3 mL/min (by C-G formula based on Cr of 0.82).   Medical History: Past Medical History  Diagnosis Date  . Angina at rest [facility])   . Hypertension   . Diabetes mellitus without complication ([place])   . Coronary artery disease   . High cholesterol     Assessment: 60 y/o M transferred from [facility] with new onset PE, also found to have likely renal CA with metastatic diseases to the lungs.  HL 0.30 (low end of therapeutic), Hgb 11.2, Plt 413, Per RN no additional bleeding, still having some blood in sputum similar to presentation on admission.  Goal of Therapy:  Heparin level 0.3-0.7 units/ml Monitor platelets by anticoagulation protocol: Yes   Plan:  - Increase heparin drip to 1650 units/hr - Check 6hr HL  - Monitor daily HL and CBC - Monitor closely for any additional bleeding/continue to trend Hgb  Dimitri Ped, PharmD. PGY-1 Pharmacy Resident Pager: [phone] 05/10/2016,1:24 PM

## 2016-05-10 NOTE — Progress Notes (Signed)
PROGRESS NOTE  Eric Villarreal [id number] DOB: Dec 26, 1955 DOA: 05/08/2016 PCP: Maricela Curet, MD  Brief History:  60 year old male with a history of COPD, CAD, hypertension, diabetes mellitus, hyperlipidemia presented with cough, hemoptysis and shortness of breath. The patient smokes 2 packs per day. Apparently, the patient also had abdominal pain and hematochezia. The patient recently saw his primary care provider who started him on levofloxacin for pneumonia. Unfortunately, his symptoms persisted and he presented to the ED at [facility] or CT angiogram of the chest found the patient to have bilateral pulmonary emboli. He was also noted to have a renal mass with metastatic disease to the lung. The patient was started on intravenous heparin and transferred to [facility].  The patient was initially admitted to the ICU. The patient remained hemodynamically stable, and he was transferred to  The medical floor on 05/09/16.  [facility] assumed care on 05/10/16.  Med Onc was consulted.  Dr. Burr Medico saw the patient on 05/09/16 and recommended lung biopsy for definitive tissue diagnosis.  Assessment/Plan: Acute pulmonary emboli/hemoptysis -Emboli burden is mild per radiology report -Continue intravenous heparin D#3 -echo -plan to ultimately transition to po anticoagulation -trend Hgb  IVC thrombus -noted on CT abd/pelvis (6/9) -continue heparin  Left renal mass -Concerning for renal cell carcinoma with metastasis to lung -Appreciate medical oncology consult--> lung biopsy if possible  COPD -Stable without exacerbation -Continue Brovana and incruse for now  Lung nodules -ask IR to attempt biopsy -case discussed with pulmonary who felt it was reasonable to pursue lung biopsy as pt as been on antiocoagulation 3 days now  Pneumonia -suspect aspiration -continue unasyn  Hematuria -likely due to renal mass -trend Hgb  Hyponatremia -stable -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 No hematochezia presently.   Objective: Filed Vitals:   05/09/16 2127 05/10/16 0504 05/10/16 0842 05/10/16 0852  BP: 97/55 94/53  94/54  Pulse: 79 71  80  Temp: 97.9 F (36.6 C) 97.4 F (36.3 C)  98.2 F (36.8 C)  TempSrc: Oral Oral  Oral  Resp: 14 16  18   Height:      Weight: 73.8 kg (162 lb 11.2 oz)     SpO2: 97% 97% 98% 94%    Intake/Output Summary (Last 24 hours) at 05/10/16 1501 Last data filed at 05/10/16 1450  Gross per 24 hour  Intake    960 ml  Output    425 ml  Net    535 ml   Weight change: 5.307 kg (11 lb 11.2 oz) Exam:   General:  Pt is alert, follows commands appropriately, not in acute distress  HEENT: No icterus, No thrush, No neck mass, [place]/AT  Cardiovascular: RRR, S1/S2, no rubs, no gallops  Respiratory: Bilateral scattered rales, right greater than left. No wheezing.  Abdomen: Soft/+BS, non tender, non distended, no guarding  Extremities: No edema, No lymphangitis, No petechiae, No rashes, no synovitis   Data Reviewed: I have personally reviewed following labs and imaging studies Basic Metabolic Panel:  Recent Labs Lab 05/08/16 1641 05/09/16 0140 05/10/16 0310  NA 130* 134* 134*  K 4.3  4.2 4.3  CL 93* 100* 100*  CO2 28 28 28   GLUCOSE 101* 89 123*  BUN 28* 18 13  CREATININE 1.11 0.85 0.82  CALCIUM 10.2 9.2 9.2  MG  --  1.7  --   PHOS  --  3.4  --    Liver Function Tests:  Recent Labs Lab 05/08/16 1641 05/09/16 0140  AST 17 14*  ALT 12* 11*  ALKPHOS 179* 145*  BILITOT 1.0 1.0  PROT 7.3 5.3*  ALBUMIN 2.5* 1.8*    Recent Labs Lab 05/09/16 0140  LIPASE 18    No results for input(s): AMMONIA in the last 168 hours. Coagulation Profile:  Recent Labs Lab 05/08/16 1641 05/09/16 0140  INR 1.12 1.17   CBC:  Recent Labs Lab 05/08/16 1641 05/10/16 0325  WBC 16.1* 11.9*  NEUTROABS 13.6*  --   HGB 15.1 11.2*  HCT 46.9 37.8*  MCV 79.6 80.1  PLT 396 413*   Cardiac Enzymes:  Recent Labs Lab 05/09/16 0140 05/09/16 0700 05/09/16 1850  TROPONINI 0.03 <0.03 0.33*   BNP: Invalid input(s): POCBNP CBG:  Recent Labs Lab 05/08/16 2216 05/09/16 0007 05/09/16 0336  GLUCAP 127* 92 86   HbA1C: No results for input(s): HGBA1C in the last 72 hours. Urine analysis:    Component Value Date/Time   COLORURINE AMBER* 05/08/2016 [place] 05/08/2016 1755   LABSPEC 1.020 05/08/2016 1755   PHURINE 5.0 05/08/2016 1755   GLUCOSEU 100* 05/08/2016 1755   HGBUR LARGE* 05/08/2016 1755   BILIRUBINUR MODERATE* 05/08/2016 1755   KETONESUR TRACE* 05/08/2016 1755   PROTEINUR 100* 05/08/2016 1755   NITRITE POSITIVE* 05/08/2016 1755   LEUKOCYTESUR NEGATIVE 05/08/2016 1755   Sepsis Labs: @LABRCNTIP (procalcitonin:4,lacticidven:4) ) Recent Results (from the past 240 hour(s))  Blood culture (routine x 2)     Status: None (Preliminary result)   Collection Time: 05/08/16  5:04 PM  Result Value Ref Range Status   Specimen Description BLOOD LEFT ARM  Final   Special Requests BOTTLES DRAWN AEROBIC AND ANAEROBIC 6CC EACH  Final   Culture NO GROWTH 2 DAYS  Final   Report Status PENDING  Incomplete  Blood culture (routine x 2)     Status: None (Preliminary result)   Collection Time: 05/08/16  5:07 PM  Result Value Ref Range Status   Specimen Description RIGHT ANTECUBITAL  Final   Special Requests BOTTLES DRAWN AEROBIC AND ANAEROBIC [place]  Final   Culture NO GROWTH 2 DAYS  Final   Report Status PENDING  Incomplete  Urine culture     Status: Abnormal   Collection Time: 05/08/16  5:55 PM  Result Value Ref Range Status   Specimen  Description URINE, CLEAN CATCH  Final   Special Requests NONE  Final   Culture (A)  Final    <10,000 COLONIES/mL INSIGNIFICANT GROWTH Performed at [facility]    Report Status 05/09/2016 FINAL  Final  MRSA PCR Screening     Status: None   Collection Time: 05/08/16 11:30 PM  Result Value Ref Range Status   MRSA by PCR NEGATIVE NEGATIVE Final    Comment:        The GeneXpert MRSA Assay (FDA approved for NASAL specimens only), is one component of a comprehensive MRSA colonization surveillance program. It is not intended to diagnose MRSA infection nor to guide or monitor treatment for MRSA infections. Performed at [facility]      Scheduled Meds: . ampicillin-sulbactam (UNASYN) IV  3 g  Intravenous Q6H  . antiseptic oral rinse  7 mL Mouth Rinse BID  . umeclidinium bromide  1 puff Inhalation Daily   And  . arformoterol  15 mcg Nebulization BID  . calcium carbonate  1 tablet Oral BID  . dapsone  150 mg Oral Daily  . feeding supplement  1 Container Oral TID BM  . minocycline  100 mg Oral Daily  . sodium chloride flush  10-40 mL Intracatheter Q12H   Continuous Infusions: . sodium chloride 100 mL/hr at 05/09/16 0600  . heparin 1,650 Units/hr (05/10/16 1333)    Procedures/Studies: Dg Chest 2 View  05/04/2016  CLINICAL DATA:  Cough hemoptysis EXAM: CHEST  2 VIEW COMPARISON:  None. FINDINGS: Cardiac shadow is within normal limits. The lungs are hyperinflated. Increased density is noted in the right upper lobe consistent with acute infiltrate. Multiple pulmonary nodules are noted bilaterally consistent with metastatic disease. Further evaluation by means of CT of the chest with contrast is recommended. IMPRESSION: COPD. Right upper lobe infiltrate. Multiple pulmonary nodules consistent with metastatic disease. CT of the chest is recommended for further evaluation. These results will be called to the ordering clinician or representative by the Radiologist  Assistant, and communication documented in the PACS or zVision Dashboard. Electronically Signed   By: Inez Catalina M.D.   On: 05/04/2016 22:10   Ct Angio Chest Pe W/cm &/or Wo Cm  05/08/2016  CLINICAL DATA:  Spitting up blood. Blood in stool. Weakness. Angina. EXAM: CT ANGIOGRAPHY CHEST CT ABDOMEN AND PELVIS WITH CONTRAST TECHNIQUE: Multidetector CT imaging of the chest was performed using the standard protocol during bolus administration of intravenous contrast. Multiplanar CT image reconstructions and MIPs were obtained to evaluate the vascular anatomy. Multidetector CT imaging of the abdomen and pelvis was performed using the standard protocol during bolus administration of intravenous contrast. CONTRAST:  100 mL of Isovue 370 COMPARISON:  None FINDINGS: CTA CHEST FINDINGS The central airways are normal. Evaluation of the lungs is somewhat limited due to respiratory motion. There is widespread infiltrate in the right upper lobe consistent with pneumonia. There are innumerable nodules throughout both lungs consistent with metastatic disease. A representative nodule posteriorly in the right lung base on series 10, image 123 measures 2.7 x 2.1 cm. There is enlarged lymph node in the right hilum on series 8, image 81 measuring 3.9 by 3.4 cm. There is a prominent subcarinal node. Mildly prominent nodes seen in the mediastinum. The thoracic aorta measures 4.2 cm improved. No dissection. No pleural or pericardial effusions. There are coronary artery calcifications. The study is positive for pulmonary emboli most prominent in the right upper and right middle lobes. The embolus burden is relatively mild. No convincing evidence of heart strain. CT ABDOMEN and PELVIS FINDINGS No free air. The patient is cachectic and there is evidence of volume load with increased attenuation in the subcutaneous and abdominal fat. There is a large mass in the left kidney measuring 8.8 by 7.8 by 9.9 cm, centered in the lower pole. There is  no extension of tumor into the peripheral left renal vein. There is extensive thrombus in the IVC, probably extending inferiorly into the iliac vessels. The thrombus extends to the level of the renal vein drainage. There is at least 1 small filling defect in the central left renal vein but there is no definitive connection to the left renal mass. The defect in the left renal vein is best seen on coronal image 59. The right kidney is normal. The intrahepatic  portions of the portal vein are patent. There does appear to be thrombus within the confluence of the splenic and superior mesenteric veins best seen on coronal image 43. The attenuation of the liver is somewhat heterogeneous but no discrete metastases are seen. The gallbladder is unremarkable. There is a mass in the medial spleen on axial image 18. While nonspecific, a metastatic lesion is possible measuring up to 2.6 cm. The right adrenal gland and pancreas are normal. No discrete nodules seen in the left adrenal gland. There are multiple abnormal lymph nodes in the left side of the retroperitoneum between the kidney in the aorta. A representative node to the left of the aorta on axial image 31 and measures 2.6 by 2.1 cm. The abdominal aorta is normal in caliber. The stomach is poorly evaluated but grossly unremarkable. The small bowel is nonobstructed. The colon is normal. The appendix not visualized but there is no evidence of appendicitis. The pelvis demonstrates probable extension of thrombus into the iliac vessels. Pelvic loops of bowel are unremarkable. The bladder is thick-walled but otherwise unremarkable. No discrete adenopathy. No filling defects in the renal collecting systems on delayed images. No bony metastatic disease. Review of the MIP images confirms the above findings. IMPRESSION: 1. The study is positive for pulmonary emboli. There is extensive thrombus in the IVC extending inferiorly into the iliac veins. There is a small amount of thrombus  in the left renal vein as well as a small amount of thrombus in the splenic and superior mesenteric veins. 2. 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 A mass in the spleen is nonspecific but could represent a metastatic lesion. 3. Right upper lobe pneumonia. 4. 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 Findings called to Dr. Noemi Chapel Electronically Signed   By: Dorise Bullion III M.D   On: 05/08/2016 19:35   Ct Abdomen Pelvis W Contrast  05/08/2016  CLINICAL DATA:  Spitting up blood. Blood in stool. Weakness. Angina. EXAM: CT ANGIOGRAPHY CHEST CT ABDOMEN AND PELVIS WITH CONTRAST TECHNIQUE: Multidetector CT imaging of the chest was performed using the standard protocol during bolus administration of intravenous contrast. Multiplanar CT image reconstructions and MIPs were obtained to evaluate the vascular anatomy. Multidetector CT imaging of the abdomen and pelvis was performed using the standard protocol during bolus administration of intravenous contrast. CONTRAST:  100 mL of Isovue 370 COMPARISON:  None FINDINGS: CTA CHEST FINDINGS The central airways are normal. Evaluation of the lungs is somewhat limited due to respiratory motion. There is widespread infiltrate in the right upper lobe consistent with pneumonia. There are innumerable nodules throughout both lungs consistent with metastatic disease. A representative nodule posteriorly in the right lung base on series 10, image 123 measures 2.7 x 2.1 cm. There is enlarged lymph node in the right hilum on series 8, image 81 measuring 3.9 by 3.4 cm. There is a prominent subcarinal node. Mildly prominent nodes seen in the mediastinum. The thoracic aorta measures 4.2 cm improved. No dissection. No pleural or pericardial effusions. There are coronary artery calcifications. The study is positive for pulmonary emboli most prominent in the right upper and right middle lobes.  The embolus burden is relatively mild. No convincing evidence of heart strain. CT ABDOMEN and PELVIS FINDINGS No free air. The patient is cachectic and there is evidence of volume load with increased attenuation in the subcutaneous and abdominal fat. There is a large mass in the left  kidney measuring 8.8 by 7.8 by 9.9 cm, centered in the lower pole. There is no extension of tumor into the peripheral left renal vein. There is extensive thrombus in the IVC, probably extending inferiorly into the iliac vessels. The thrombus extends to the level of the renal vein drainage. There is at least 1 small filling defect in the central left renal vein but there is no definitive connection to the left renal mass. The defect in the left renal vein is best seen on coronal image 59. The right kidney is normal. The intrahepatic portions of the portal vein are patent. There does appear to be thrombus within the confluence of the splenic and superior mesenteric veins best seen on coronal image 43. The attenuation of the liver is somewhat heterogeneous but no discrete metastases are seen. The gallbladder is unremarkable. There is a mass in the medial spleen on axial image 18. While nonspecific, a metastatic lesion is possible measuring up to 2.6 cm. The right adrenal gland and pancreas are normal. No discrete nodules seen in the left adrenal gland. There are multiple abnormal lymph nodes in the left side of the retroperitoneum between the kidney in the aorta. A representative node to the left of the aorta on axial image 31 and measures 2.6 by 2.1 cm. The abdominal aorta is normal in caliber. The stomach is poorly evaluated but grossly unremarkable. The small bowel is nonobstructed. The colon is normal. The appendix not visualized but there is no evidence of appendicitis. The pelvis demonstrates probable extension of thrombus into the iliac vessels. Pelvic loops of bowel are unremarkable. The bladder is thick-walled but otherwise  unremarkable. No discrete adenopathy. No filling defects in the renal collecting systems on delayed images. No bony metastatic disease. Review of the MIP images confirms the above findings. IMPRESSION: 1. The study is positive for pulmonary emboli. There is extensive thrombus in the IVC extending inferiorly into the iliac veins. There is a small amount of thrombus in the left renal vein as well as a small amount of thrombus in the splenic and superior mesenteric veins. 2. 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 A mass in the spleen is nonspecific but could represent a metastatic lesion. 3. Right upper lobe pneumonia. 4. 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 Findings called to Dr. Noemi Chapel Electronically Signed   By: Dorise Bullion III M.D   On: 05/08/2016 19:35   Dg Chest Port 1 View  05/10/2016  CLINICAL DATA:  Acute respiratory failure, hypoxemia EXAM: PORTABLE CHEST 1 VIEW COMPARISON:  05/08/2016 FINDINGS: Cardiomediastinal silhouette is stable. Right IJ central line is stable in position. Persistent patchy infiltrate/ pneumonia right upper lobe. Bilateral pulmonary nodules are again noted. New streaky bilateral basilar atelectasis or infiltrate. IMPRESSION: Persistent bilateral pulmonary nodules consistent with metastatic disease. Persistent patchy infiltrate/pneumonia in right upper lobe. New streaky bilateral basilar atelectasis or infiltrate. Right IJ central line is unchanged in position. Electronically Signed   By: Lahoma Crocker M.D.   On: 05/10/2016 09:22   Dg Chest Portable 1 View  05/08/2016  CLINICAL DATA:  60 year old male with central line placement. Renal cell carcinoma with metastasis to the lung. EXAM: PORTABLE CHEST 1 VIEW COMPARISON:  Chest CT dated 05/09/2016 FINDINGS: Right IJ central line with tip over central SVC. There is no pneumothorax. There is emphysematous changes of lungs. A patchy  area of airspace opacity in the right upper lobe as seen on  the prior study most likely pneumonia. There are multiple scattered pulmonary nodules as seen on the prior CT most compatible with metastatic disease. There is no pleural effusion. The cardiac silhouette is within normal limits. No acute osseous pathology. IMPRESSION: Right IJ central line with tip over central SVC.  No pneumothorax. Emphysema with right upper lobe infiltrate as well as multiple pulmonary nodules/metastatic disease. These findings are better seen on the earlier CT. Electronically Signed   By: Anner Crete M.D.   On: 05/08/2016 22:16    Taneya Conkel, DO  [facility] Pager [phone]  If 7PM-7AM, please contact night-coverage [email or web address] Password [email or web address] 05/10/2016, 3:01 PM   LOS: 1 day

## 2016-05-10 NOTE — Progress Notes (Signed)
Transferred in from 31M, Alert and orienteD x 4. With heparin drip @ 10 cc/hr at the left forearm .Right  Internal jugular central line with NS  @100cc /hr.No skin issue noted as per assessment with the Charge Nurse(Anita).Placed in bed comfortbly.

## 2016-05-10 NOTE — Progress Notes (Signed)
[place] for Heparin  Indication: pulmonary embolus  Allergies  Allergen Reactions  . Doxycycline Rash   Patient Measurements: Height: 6\' 4"  (193 cm) Weight: 172 lb (78.019 kg) IBW/kg (Calculated) : 86.8  Vital Signs: Temp: 98.4 F (36.9 C) (06/11 2014) Temp Source: Oral (06/11 1724) BP: 103/58 mmHg (06/11 2014) Pulse Rate: 81 (06/11 2014)  Labs:  Recent Labs  05/08/16 1641 05/09/16 0140  05/09/16 0700 05/09/16 1850 05/10/16 0310 05/10/16 0325 05/10/16 1250 05/10/16 2040  HGB 15.1  --   --   --   --   --  11.2*  --   --   HCT 46.9  --   --   --   --   --  37.8*  --   --   PLT 396  --   --   --   --   --  413*  --   --   LABPROT 14.6 15.1  --   --   --   --   --   --   --   INR 1.12 1.17  --   --   --   --   --   --   --   HEPARINUNFRC  --   --   < >  --  <0.10*  --  <0.10* 0.30 <0.10*  CREATININE 1.11 0.85  --   --   --  0.82  --   --   --   TROPONINI  --  0.03  --  <0.03 0.33*  --   --   --   --   < > = values in this interval not displayed.  Estimated Creatinine Clearance: 107 mL/min (by C-G formula based on Cr of 0.82).   Medical History: Past Medical History  Diagnosis Date  . Angina at rest [facility])   . Hypertension   . Diabetes mellitus without complication ([place])   . Coronary artery disease   . High cholesterol     Assessment: 60 y/o M transferred from [facility] with new onset PE, also found to have likely renal CA with metastatic diseases to the lungs.  PM HL < 0.10  Goal of Therapy:  Heparin level 0.3-0.7 units/ml Monitor platelets by anticoagulation protocol: Yes   Plan:  - Increase heparin drip to 1850 units/hr - Follow up AM labs  Thank you Anette Guarneri, PharmD [phone]  -05/10/2016,10:07 PM

## 2016-05-10 NOTE — Progress Notes (Signed)
[place] for Heparin  Indication: pulmonary embolus  Allergies  Allergen Reactions  . Doxycycline Rash   Patient Measurements: Height: 6\' 4"  (193 cm) Weight: 162 lb 11.2 oz (73.8 kg) IBW/kg (Calculated) : 86.8  Vital Signs: Temp: 97.9 F (36.6 C) (06/10 2127) Temp Source: Oral (06/10 2127) BP: 97/55 mmHg (06/10 2127) Pulse Rate: 79 (06/10 2127)  Labs:  Recent Labs  05/08/16 1641 05/09/16 0140 05/09/16 0605 05/09/16 0700 05/09/16 1850 05/10/16 0325  HGB 15.1  --   --   --   --  11.2*  HCT 46.9  --   --   --   --  37.8*  PLT 396  --   --   --   --  413*  LABPROT 14.6 15.1  --   --   --   --   INR 1.12 1.17  --   --   --   --   HEPARINUNFRC  --   --  <0.10*  --  <0.10* <0.10*  CREATININE 1.11 0.85  --   --   --   --   TROPONINI  --  0.03  --  <0.03 0.33*  --     Estimated Creatinine Clearance: 97.7 mL/min (by C-G formula based on Cr of 0.85).   Medical History: Past Medical History  Diagnosis Date  . Angina at rest [facility])   . Hypertension   . Diabetes mellitus without complication ([place])   . Coronary artery disease   . High cholesterol     Assessment: 60 y/o M transfer from [facility] with new onset PE, also found to have likely renal CA with metastatic diseases to the lungs, noted drop in Hgb, only bleeding per RN is very small amount of blood in sputum which was present on admission as well.   Goal of Therapy:  Heparin level 0.3-0.7 units/ml Monitor platelets by anticoagulation protocol: Yes   Plan:  -Heparin 2000 units BOLUS  -Increase heparin drip to 1500 units/hr -1300 HL -Monitor closely for any additional bleeding/continue to trend Hgb  Narda Bonds 05/10/2016,4:34 AM

## 2016-05-11 DIAGNOSIS — C801 Malignant (primary) neoplasm, unspecified: Secondary | ICD-10-CM

## 2016-05-11 LAB — CBC
HEMATOCRIT: 38.8 % — AB (ref 39.0–52.0)
Hemoglobin: 11.5 g/dL — ABNORMAL LOW (ref 13.0–17.0)
MCH: 24.4 pg — AB (ref 26.0–34.0)
MCHC: 29.6 g/dL — AB (ref 30.0–36.0)
MCV: 82.4 fL (ref 78.0–100.0)
Platelets: 470 10*3/uL — ABNORMAL HIGH (ref 150–400)
RBC: 4.71 MIL/uL (ref 4.22–5.81)
RDW: 19.4 % — AB (ref 11.5–15.5)
WBC: 12.8 10*3/uL — ABNORMAL HIGH (ref 4.0–10.5)

## 2016-05-11 LAB — BASIC METABOLIC PANEL
Anion gap: 7 (ref 5–15)
BUN: 9 mg/dL (ref 6–20)
CHLORIDE: 100 mmol/L — AB (ref 101–111)
CO2: 27 mmol/L (ref 22–32)
CREATININE: 0.68 mg/dL (ref 0.61–1.24)
Calcium: 9.2 mg/dL (ref 8.9–10.3)
GFR calc non Af Amer: >60 mL/min (ref >60)
GLUCOSE: 87 mg/dL (ref 65–99)
Potassium: 4.4 mmol/L (ref 3.5–5.1)
Sodium: 134 mmol/L — ABNORMAL LOW (ref 135–145)

## 2016-05-11 LAB — HEPARIN LEVEL (UNFRACTIONATED): Heparin Unfractionated: <0.1 IU/mL — ABNORMAL LOW (ref 0.30–0.70)

## 2016-05-11 MED ORDER — ENOXAPARIN SODIUM 120 MG/0.8ML ~~LOC~~ SOLN
120.0000 mg | SUBCUTANEOUS | Status: DC
  Administered 2016-05-11 – 2016-05-12 (×2): 120 mg via SUBCUTANEOUS
  Filled 2016-05-11 (×2): qty 0.8

## 2016-05-11 MED ORDER — AMOXICILLIN-POT CLAVULANATE 875-125 MG PO TABS
1.0000 | ORAL_TABLET | Freq: Two times a day (BID) | ORAL | Status: DC
  Administered 2016-05-11 – 2016-05-12 (×2): 1 via ORAL
  Filled 2016-05-11 (×2): qty 1

## 2016-05-11 MED ORDER — HEPARIN BOLUS VIA INFUSION
2000.0000 [IU] | Freq: Once | INTRAVENOUS | Status: AC
  Administered 2016-05-11: 2000 [IU] via INTRAVENOUS
  Filled 2016-05-11: qty 2000

## 2016-05-11 NOTE — Progress Notes (Signed)
[place] for Heparin>> lovenox Indication: pulmonary embolus  Allergies  Allergen Reactions  . Doxycycline Rash   Patient Measurements: Height: 6\' 4"  (193 cm) Weight: 172 lb (78.019 kg) IBW/kg (Calculated) : 86.8  Vital Signs: Temp: 98.6 F (37 C) (06/12 0829) Temp Source: Oral (06/12 0829) BP: 102/58 mmHg (06/12 0829) Pulse Rate: 89 (06/12 0829)  Labs:  Recent Labs  05/09/16 0140  05/09/16 0700 05/09/16 1850 05/10/16 0310 05/10/16 0325 05/10/16 1250 05/10/16 2040 05/11/16 0545  HGB  --   --   --   --   --  11.2*  --   --  11.5*  HCT  --   --   --   --   --  37.8*  --   --  38.8*  PLT  --   --   --   --   --  413*  --   --  470*  LABPROT 15.1  --   --   --   --   --   --   --   --   INR 1.17  --   --   --   --   --   --   --   --   HEPARINUNFRC  --   < >  --  <0.10*  --  <0.10* 0.30 <0.10* <0.10*  CREATININE 0.85  --   --   --  0.82  --   --   --  0.68  TROPONINI 0.03  --  <0.03 0.33*  --   --   --   --   --   < > = values in this interval not displayed.  Estimated Creatinine Clearance: 109.7 mL/min (by C-G formula based on Cr of 0.68).   Medical History: Past Medical History  Diagnosis Date  . Angina at rest [facility])   . Hypertension   . Diabetes mellitus without complication ([place])   . Coronary artery disease   . High cholesterol     Assessment: 60 y/o M transfer from [facility] with new onset PE, also found to have likely renal CA with metastatic diseases, He is on heparin and pharmacy consulted to transition to lovenox. -Hg= 11.5, plt= 470, SCr= 0.68 and CrCl > 100  Goal of Therapy:  Heparin level 0.3-0.7 units/ml Monitor platelets by anticoagulation protocol: Yes   Plan:  -Discontinue heparin  -lovenox 120mg  (1.5mg /kg) sq daily -CBC every 3 days while inpatient  Hildred Laser, Pharm D 05/11/2016 4:44 PM

## 2016-05-11 NOTE — Progress Notes (Signed)
PCCM PROGRESS NOTE  Date of Admission: 05/08/2016  Referring provider:  Dr. Sabra Heck, ER  Chief complaint: Weakness  Subjective: Denies chest pain.  Vital signs: BP 102/58 mmHg  Pulse 89  Temp(Src) 98.6 F (37 C) (Oral)  Resp 20  Ht 6\' 4"  (1.93 m)  Wt 172 lb (78.019 kg)  BMI 20.95 kg/m2  SpO2 92%  General: thin HEENT: poor dentition Cardiac: regular, no murmur Chest: faint b/l crackles Abd: soft, non tender Ext: no edema Skin: no rashes   CBC Recent Labs     05/08/16  1641  05/10/16  0325  05/11/16  0545  WBC  16.1*  11.9*  12.8*  HGB  15.1  11.2*  11.5*  HCT  46.9  37.8*  38.8*  PLT  396  413*  470*    Coag's Recent Labs     05/08/16  1641  05/09/16  0140  INR  1.12  1.17    BMET Recent Labs     05/09/16  0140  05/10/16  0310  05/11/16  0545  NA  134*  134*  134*  K  4.2  4.3  4.4  CL  100*  100*  100*  CO2  28  28  27   BUN  18  13  9   CREATININE  0.85  0.82  0.68  GLUCOSE  89  123*  87    Electrolytes Recent Labs     05/09/16  0140  05/10/16  0310  05/11/16  0545  CALCIUM  9.2  9.2  9.2  MG  1.7   --    --   PHOS  3.4   --    --     Liver Enzymes Recent Labs     05/08/16  1641  05/09/16  0140  AST  17  14*  ALT  12*  11*  ALKPHOS  179*  145*  BILITOT  1.0  1.0  ALBUMIN  2.5*  1.8*    Cardiac Enzymes Recent Labs     05/09/16  0140  05/09/16  0700  05/09/16  1850  TROPONINI  0.03  <0.03  0.33*    Glucose Recent Labs     05/08/16  2216  05/09/16  0007  05/09/16  0336  GLUCAP  127*  92  86    Imaging Dg Chest Port 1 View  05/10/2016  CLINICAL DATA:  Acute respiratory failure, hypoxemia EXAM: PORTABLE CHEST 1 VIEW COMPARISON:  05/08/2016 FINDINGS: Cardiomediastinal silhouette is stable. Right IJ central line is stable in position. Persistent patchy infiltrate/ pneumonia right upper lobe. Bilateral pulmonary nodules are again noted. New streaky bilateral basilar atelectasis or infiltrate. IMPRESSION: Persistent  bilateral pulmonary nodules consistent with metastatic disease. Persistent patchy infiltrate/pneumonia in right upper lobe. New streaky bilateral basilar atelectasis or infiltrate. Right IJ central line is unchanged in position. Electronically Signed   By: Lahoma Crocker M.D.   On: 05/10/2016 09:22    Studies 6/09 CT angio chest >> RUL and RML PE, infiltrate RUL, innumerable nodules b/l 6/09 CT abd/pelvis >> 9.9 cm Lt renal mass, thrombus in IVC, splenic mass  Discussion: 60 yo male smoker with cough and hemoptysis from acute pulmonary embolism, pneumonia, Lt renal mass, and innumerable metastatic pulmonary nodules.  Assessment/plan:  Pulmonary nodules >> most likely metastatic lesions related to renal mass. - IR consulted >> they do not think transcutaneous needle biopsy is feasible - he will need bronchoscopy for tissue diagnosis >> would prefer to have him  complete at least two weeks of therapy for acute PE and complete course of therapy for pneumonia  Acute pulmonary embolism. - anticoagulation regimen per primary team  Pneumonia. - Abx per primary team  Hx of COPD. Tobacco abuse. - smoking cessation  I have scheduled him for pulmonary follow up with Eric Form on Monday, June 26 at 11 am.  Depending on his status, will then coordinate best approach for obtaining tissue sampling for lung nodules.  Updated pts family at bedside.  Chesley Mires, MD [facility] Pulmonary/Critical Care 05/11/2016, 2:55 PM Pager:  [phone] After 3pm call: [phone]

## 2016-05-11 NOTE — Progress Notes (Signed)
PROGRESS NOTE  Eric Villarreal [id number] DOB: 03/13/1956 DOA: 05/08/2016 PCP: Maricela Curet, MD  Brief History:  60 year old male with a history of COPD, CAD, hypertension, diabetes mellitus, hyperlipidemia presented with cough, hemoptysis and shortness of breath. The patient smokes 2 packs per day. Apparently, the patient also had abdominal pain and hematochezia. The patient recently saw his primary care provider who started him on levofloxacin for pneumonia. Unfortunately, his symptoms persisted and he presented to the ED at [facility] or CT angiogram of the chest found the patient to have bilateral pulmonary emboli. He was also noted to have a renal mass with metastatic disease to the lung. The patient was started on intravenous heparin and transferred to [facility]. The patient was initially admitted to the ICU. The patient remained hemodynamically stable, and he was transferred to [facility] floor on 05/09/16. [facility] assumed care on 05/10/16. Med Onc was consulted. Dr. Burr Medico saw the patient on 05/09/16 and recommended lung biopsy for definitive tissue diagnosis.  Assessment/Plan: Acute pulmonary emboli/hemoptysis -Emboli burden is mild per radiology report -Intravenous heparin D#4-->[place] Lovenox -echo -plan to ultimately transition to po anticoagulation -trend Hgb--stable -05/11/16--discussed with Med Onc, Dr. Marvis Moeller with Lovenox  IVC thrombus -noted on CT abd/pelvis (6/9) -05/11/16--discussed with Med Onc, Dr. Marvis Moeller with Lovenox  Left renal mass -Concerning for renal cell carcinoma with metastasis to lung -Appreciate medical oncology consult--> lung biopsy if possible  COPD -Stable without exacerbation -Continue Brovana and incruse for now  Lung nodules -ask IR to attempt biopsy--discussed with Dr. 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 Denies any fevers, chills, nausea, vomiting, diarrhea. Complains of constipation.  Objective: Filed Vitals:   05/11/16 1400 05/11/16 1500 05/11/16 1600 05/11/16 1822  BP:    106/67  Pulse:    83  Temp:    98.1 F (36.7 C)  TempSrc:    Oral  Resp:    19  Height:      Weight:      SpO2: 92% 92% 91% 96%    Intake/Output Summary (Last 24 hours) at 05/11/16 1844 Last data filed at 05/11/16 1822  Gross per 24 hour  Intake 5594.09 ml  Output   1675 ml  Net 3919.09 ml   Weight change: 4.218 kg (9 lb 4.8 oz) Exam:   General:  Pt is alert, follows commands appropriately, not in acute distress  HEENT: No icterus, No thrush, No neck mass, [place]/AT  Cardiovascular: RRR, S1/S2, no rubs, no gallops  Respiratory: Bilateral scattered rales.  Abdomen: Soft/+BS, periumbilical tenderness without rebound, non distended, no guarding  Extremities: No edema, No lymphangitis, No petechiae, No rashes, no synovitis   Data Reviewed: I have personally reviewed following labs and imaging studies Basic Metabolic Panel:  Recent Labs Lab 05/08/16 1641 05/09/16 0140 05/10/16 0310 05/11/16 0545  NA 130* 134* 134* 134*  K 4.3 4.2 4.3 4.4  CL 93* 100* 100* 100*  CO2 28 28 28 27   GLUCOSE 101* 89 123* 87  BUN 28* 18 13 9   CREATININE 1.11 0.85 0.82 0.68  CALCIUM 10.2 9.2 9.2 9.2  MG  --  1.7  --   --   PHOS  --  3.4  --   --    Liver  Function Tests:  Recent Labs Lab 05/08/16 1641 05/09/16 0140  AST 17 14*  ALT 12* 11*  ALKPHOS 179* 145*  BILITOT 1.0 1.0  PROT 7.3 5.3*  ALBUMIN 2.5* 1.8*    Recent Labs Lab 05/09/16 0140  LIPASE 18   No results for input(s): AMMONIA in the last 168 hours. Coagulation Profile:  Recent Labs Lab 05/08/16 1641 05/09/16 0140  INR 1.12 1.17   CBC:  Recent Labs Lab 05/08/16 1641 05/10/16 0325 05/11/16 0545  WBC 16.1* 11.9* 12.8*  NEUTROABS 13.6*  --   --   HGB 15.1 11.2* 11.5*  HCT 46.9 37.8* 38.8*  MCV 79.6 80.1 82.4  PLT 396 413* 470*   Cardiac Enzymes:  Recent Labs Lab 05/09/16 0140 05/09/16 0700 05/09/16 1850  TROPONINI 0.03 <0.03 0.33*   BNP: Invalid input(s): POCBNP CBG:  Recent Labs Lab 05/08/16 2216 05/09/16 0007 05/09/16 0336  GLUCAP 127* 92 86   HbA1C: No results for input(s): HGBA1C in the last 72 hours. Urine analysis:    Component Value Date/Time   COLORURINE AMBER* 05/08/2016 [place] 05/08/2016 1755   LABSPEC 1.020 05/08/2016 1755   PHURINE 5.0 05/08/2016 1755   GLUCOSEU 100* 05/08/2016 1755   HGBUR LARGE* 05/08/2016 1755   BILIRUBINUR MODERATE* 05/08/2016 1755   KETONESUR TRACE* 05/08/2016 1755   PROTEINUR 100* 05/08/2016 1755   NITRITE POSITIVE* 05/08/2016 1755   LEUKOCYTESUR NEGATIVE 05/08/2016 1755   Sepsis Labs: @LABRCNTIP (procalcitonin:4,lacticidven:4) ) Recent Results (from the past 240 hour(s))  Blood culture (routine x 2)     Status: None (Preliminary result)   Collection Time: 05/08/16  5:04 PM  Result Value Ref Range Status   Specimen Description BLOOD LEFT ARM  Final   Special Requests BOTTLES DRAWN AEROBIC AND ANAEROBIC 6CC EACH  Final   Culture NO GROWTH 3 DAYS  Final   Report Status PENDING  Incomplete  Blood culture (routine x 2)     Status: None (Preliminary result)   Collection Time: 05/08/16  5:07 PM  Result Value Ref Range Status   Specimen Description RIGHT ANTECUBITAL  Final     Special Requests BOTTLES DRAWN AEROBIC AND ANAEROBIC [place]  Final   Culture NO GROWTH 3 DAYS  Final   Report Status PENDING  Incomplete  Urine culture     Status: Abnormal   Collection Time: 05/08/16  5:55 PM  Result Value Ref Range Status   Specimen Description URINE, CLEAN CATCH  Final   Special Requests NONE  Final   Culture (A)  Final    <10,000 COLONIES/mL INSIGNIFICANT GROWTH Performed at [facility]    Report Status 05/09/2016 FINAL  Final  MRSA PCR Screening     Status: None   Collection Time: 05/08/16 11:30 PM  Result Value Ref Range Status   MRSA by PCR NEGATIVE NEGATIVE Final    Comment:        The GeneXpert MRSA Assay (FDA approved for NASAL specimens only), is one component of a comprehensive MRSA colonization surveillance program. It is not intended to diagnose MRSA infection nor to guide or monitor treatment for MRSA infections. Performed at [facility]  Scheduled Meds: . amoxicillin-clavulanate  1 tablet Oral Q12H  . ampicillin-sulbactam (UNASYN) IV  3 g Intravenous Q6H  . antiseptic oral rinse  7 mL Mouth Rinse BID  . umeclidinium bromide  1 puff Inhalation Daily   And  . arformoterol  15 mcg Nebulization BID  . dapsone  150 mg Oral Daily  . enoxaparin (LOVENOX) injection  120 mg Subcutaneous Q24H  . feeding supplement  1 Container Oral TID BM  . minocycline  100 mg Oral Daily  . sodium chloride flush  10-40 mL Intracatheter Q12H   Continuous Infusions: . sodium chloride 100 mL/hr at 05/11/16 1600    Procedures/Studies: Dg Chest 2 View  05/04/2016  CLINICAL DATA:  Cough hemoptysis EXAM: CHEST  2 VIEW COMPARISON:  None. FINDINGS: Cardiac shadow is within normal limits. The lungs are hyperinflated. Increased density is noted in the right upper lobe consistent with acute infiltrate. Multiple pulmonary nodules are noted bilaterally consistent with metastatic disease. Further evaluation by means of CT of the chest with  contrast is recommended. IMPRESSION: COPD. Right upper lobe infiltrate. Multiple pulmonary nodules consistent with metastatic disease. CT of the chest is recommended for further evaluation. These results will be called to the ordering clinician or representative by the Radiologist Assistant, and communication documented in the PACS or zVision Dashboard. Electronically Signed   By: Inez Catalina M.D.   On: 05/04/2016 22:10   Ct Angio Chest Pe W/cm &/or Wo Cm  05/08/2016  CLINICAL DATA:  Spitting up blood. Blood in stool. Weakness. Angina. EXAM: CT ANGIOGRAPHY CHEST CT ABDOMEN AND PELVIS WITH CONTRAST TECHNIQUE: Multidetector CT imaging of the chest was performed using the standard protocol during bolus administration of intravenous contrast. Multiplanar CT image reconstructions and MIPs were obtained to evaluate the vascular anatomy. Multidetector CT imaging of the abdomen and pelvis was performed using the standard protocol during bolus administration of intravenous contrast. CONTRAST:  100 mL of Isovue 370 COMPARISON:  None FINDINGS: CTA CHEST FINDINGS The central airways are normal. Evaluation of the lungs is somewhat limited due to respiratory motion. There is widespread infiltrate in the right upper lobe consistent with pneumonia. There are innumerable nodules throughout both lungs consistent with metastatic disease. A representative nodule posteriorly in the right lung base on series 10, image 123 measures 2.7 x 2.1 cm. There is enlarged lymph node in the right hilum on series 8, image 81 measuring 3.9 by 3.4 cm. There is a prominent subcarinal node. Mildly prominent nodes seen in the mediastinum. The thoracic aorta measures 4.2 cm improved. No dissection. No pleural or pericardial effusions. There are coronary artery calcifications. The study is positive for pulmonary emboli most prominent in the right upper and right middle lobes. The embolus burden is relatively mild. No convincing evidence of heart strain.  CT ABDOMEN and PELVIS FINDINGS No free air. The patient is cachectic and there is evidence of volume load with increased attenuation in the subcutaneous and abdominal fat. There is a large mass in the left kidney measuring 8.8 by 7.8 by 9.9 cm, centered in the lower pole. There is no extension of tumor into the peripheral left renal vein. There is extensive thrombus in the IVC, probably extending inferiorly into the iliac vessels. The thrombus extends to the level of the renal vein drainage. There is at least 1 small filling defect in the central left renal vein but there is no definitive connection to the left renal mass. The defect in the left renal vein is best seen  on coronal image 59. The right kidney is normal. The intrahepatic portions of the portal vein are patent. There does appear to be thrombus within the confluence of the splenic and superior mesenteric veins best seen on coronal image 43. The attenuation of the liver is somewhat heterogeneous but no discrete metastases are seen. The gallbladder is unremarkable. There is a mass in the medial spleen on axial image 18. While nonspecific, a metastatic lesion is possible measuring up to 2.6 cm. The right adrenal gland and pancreas are normal. No discrete nodules seen in the left adrenal gland. There are multiple abnormal lymph nodes in the left side of the retroperitoneum between the kidney in the aorta. A representative node to the left of the aorta on axial image 31 and measures 2.6 by 2.1 cm. The abdominal aorta is normal in caliber. The stomach is poorly evaluated but grossly unremarkable. The small bowel is nonobstructed. The colon is normal. The appendix not visualized but there is no evidence of appendicitis. The pelvis demonstrates probable extension of thrombus into the iliac vessels. Pelvic loops of bowel are unremarkable. The bladder is thick-walled but otherwise unremarkable. No discrete adenopathy. No filling defects in the renal collecting  systems on delayed images. No bony metastatic disease. Review of the MIP images confirms the above findings. IMPRESSION: 1. The study is positive for pulmonary emboli. There is extensive thrombus in the IVC extending inferiorly into the iliac veins. There is a small amount of thrombus in the left renal vein as well as a small amount of thrombus in the splenic and superior mesenteric veins. 2. 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 A mass in the spleen is nonspecific but could represent a metastatic lesion. 3. Right upper lobe pneumonia. 4. 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 Findings called to Dr. Noemi Chapel Electronically Signed   By: Dorise Bullion III M.D   On: 05/08/2016 19:35   Ct Abdomen Pelvis W Contrast  05/08/2016  CLINICAL DATA:  Spitting up blood. Blood in stool. Weakness. Angina. EXAM: CT ANGIOGRAPHY CHEST CT ABDOMEN AND PELVIS WITH CONTRAST TECHNIQUE: Multidetector CT imaging of the chest was performed using the standard protocol during bolus administration of intravenous contrast. Multiplanar CT image reconstructions and MIPs were obtained to evaluate the vascular anatomy. Multidetector CT imaging of the abdomen and pelvis was performed using the standard protocol during bolus administration of intravenous contrast. CONTRAST:  100 mL of Isovue 370 COMPARISON:  None FINDINGS: CTA CHEST FINDINGS The central airways are normal. Evaluation of the lungs is somewhat limited due to respiratory motion. There is widespread infiltrate in the right upper lobe consistent with pneumonia. There are innumerable nodules throughout both lungs consistent with metastatic disease. A representative nodule posteriorly in the right lung base on series 10, image 123 measures 2.7 x 2.1 cm. There is enlarged lymph node in the right hilum on series 8, image 81 measuring 3.9 by 3.4 cm. There is a prominent subcarinal node.  Mildly prominent nodes seen in the mediastinum. The thoracic aorta measures 4.2 cm improved. No dissection. No pleural or pericardial effusions. There are coronary artery calcifications. The study is positive for pulmonary emboli most prominent in the right upper and right middle lobes. The embolus burden is relatively mild. No convincing evidence of heart strain. CT ABDOMEN and PELVIS FINDINGS No free air. The patient is cachectic and there is evidence of volume load with increased attenuation in the subcutaneous  and abdominal fat. There is a large mass in the left kidney measuring 8.8 by 7.8 by 9.9 cm, centered in the lower pole. There is no extension of tumor into the peripheral left renal vein. There is extensive thrombus in the IVC, probably extending inferiorly into the iliac vessels. The thrombus extends to the level of the renal vein drainage. There is at least 1 small filling defect in the central left renal vein but there is no definitive connection to the left renal mass. The defect in the left renal vein is best seen on coronal image 59. The right kidney is normal. The intrahepatic portions of the portal vein are patent. There does appear to be thrombus within the confluence of the splenic and superior mesenteric veins best seen on coronal image 43. The attenuation of the liver is somewhat heterogeneous but no discrete metastases are seen. The gallbladder is unremarkable. There is a mass in the medial spleen on axial image 18. While nonspecific, a metastatic lesion is possible measuring up to 2.6 cm. The right adrenal gland and pancreas are normal. No discrete nodules seen in the left adrenal gland. There are multiple abnormal lymph nodes in the left side of the retroperitoneum between the kidney in the aorta. A representative node to the left of the aorta on axial image 31 and measures 2.6 by 2.1 cm. The abdominal aorta is normal in caliber. The stomach is poorly evaluated but grossly unremarkable. The  small bowel is nonobstructed. The colon is normal. The appendix not visualized but there is no evidence of appendicitis. The pelvis demonstrates probable extension of thrombus into the iliac vessels. Pelvic loops of bowel are unremarkable. The bladder is thick-walled but otherwise unremarkable. No discrete adenopathy. No filling defects in the renal collecting systems on delayed images. No bony metastatic disease. Review of the MIP images confirms the above findings. IMPRESSION: 1. The study is positive for pulmonary emboli. There is extensive thrombus in the IVC extending inferiorly into the iliac veins. There is a small amount of thrombus in the left renal vein as well as a small amount of thrombus in the splenic and superior mesenteric veins. 2. 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 A mass in the spleen is nonspecific but could represent a metastatic lesion. 3. Right upper lobe pneumonia. 4. 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 Findings called to Dr. Noemi Chapel Electronically Signed   By: Dorise Bullion III M.D   On: 05/08/2016 19:35   Dg Chest Port 1 View  05/10/2016  CLINICAL DATA:  Acute respiratory failure, hypoxemia EXAM: PORTABLE CHEST 1 VIEW COMPARISON:  05/08/2016 FINDINGS: Cardiomediastinal silhouette is stable. Right IJ central line is stable in position. Persistent patchy infiltrate/ pneumonia right upper lobe. Bilateral pulmonary nodules are again noted. New streaky bilateral basilar atelectasis or infiltrate. IMPRESSION: Persistent bilateral pulmonary nodules consistent with metastatic disease. Persistent patchy infiltrate/pneumonia in right upper lobe. New streaky bilateral basilar atelectasis or infiltrate. Right IJ central line is unchanged in position. Electronically Signed   By: Lahoma Crocker M.D.   On: 05/10/2016 09:22   Dg Chest Portable 1 View  05/08/2016  CLINICAL DATA:  60 year old male  with central line placement. Renal cell carcinoma with metastasis to the lung. EXAM: PORTABLE CHEST 1 VIEW COMPARISON:  Chest CT dated 05/09/2016 FINDINGS: Right IJ central line with tip over central SVC. There is no pneumothorax. There is emphysematous changes of lungs. A patchy area  of airspace opacity in the right upper lobe as seen on the prior study most likely pneumonia. There are multiple scattered pulmonary nodules as seen on the prior CT most compatible with metastatic disease. There is no pleural effusion. The cardiac silhouette is within normal limits. No acute osseous pathology. IMPRESSION: Right IJ central line with tip over central SVC.  No pneumothorax. Emphysema with right upper lobe infiltrate as well as multiple pulmonary nodules/metastatic disease. These findings are better seen on the earlier CT. Electronically Signed   By: Anner Crete M.D.   On: 05/08/2016 22:16    Eric Goodin, Eric Villarreal  [facility] Pager [phone]  If 7PM-7AM, please contact night-coverage [email or web address] Password [email or web address] 05/11/2016, 6:44 PM   LOS: 2 days

## 2016-05-11 NOTE — Progress Notes (Signed)
Paged by IR, Dr. Barbie Banner.  I spoke with him.  He feels pt needs to have bronchoscopy before considering percutaneous biopsy.  I will reconsult pulmonary.  DTat

## 2016-05-11 NOTE — Progress Notes (Signed)
[name] for Heparin  Indication: pulmonary embolus  Allergies  Allergen Reactions  . Doxycycline Rash   Patient Measurements: Height: 6\' 4"  (193 cm) Weight: 172 lb (78.019 kg) IBW/kg (Calculated) : 86.8  Vital Signs: Temp: 100 F (37.8 C) (06/12 0420) BP: 100/85 mmHg (06/12 0420) Pulse Rate: 93 (06/12 0420)  Labs:  Recent Labs  05/08/16 1641 05/09/16 0140  05/09/16 0700 05/09/16 1850 05/10/16 0310 05/10/16 0325 05/10/16 1250 05/10/16 2040 05/11/16 0545  HGB 15.1  --   --   --   --   --  11.2*  --   --  11.5*  HCT 46.9  --   --   --   --   --  37.8*  --   --  38.8*  PLT 396  --   --   --   --   --  413*  --   --  470*  LABPROT 14.6 15.1  --   --   --   --   --   --   --   --   INR 1.12 1.17  --   --   --   --   --   --   --   --   HEPARINUNFRC  --   --   < >  --  <0.10*  --  <0.10* 0.30 <0.10* <0.10*  CREATININE 1.11 0.85  --   --   --  0.82  --   --   --  0.68  TROPONINI  --  0.03  --  <0.03 0.33*  --   --   --   --   --   < > = values in this interval not displayed.  Estimated Creatinine Clearance: 109.7 mL/min (by C-G formula based on Cr of 0.68).   Medical History: Past Medical History  Diagnosis Date  . Angina at rest [facility])   . Hypertension   . Diabetes mellitus without complication ([place])   . Coronary artery disease   . High cholesterol     Assessment: 60 y/o M transfer from [facility] with new onset PE, also found to have likely renal CA with metastatic diseases to the lungs, noted drop in Hgb, only bleeding per RN is very small amount of blood in sputum which was present on admission as well.   6/12 AM: HL remains <0.1, Although heparin was charted as running at 1650 units/hr it was verified with the RN that it was truly running at 1800 units/hr.   Goal of Therapy:  Heparin level 0.3-0.7 units/ml Monitor platelets by anticoagulation protocol: Yes   Plan:  -Heparin 2000 units BOLUS  -Increase heparin drip to 2000  units/hr -1500 HL -Monitor closely for any additional bleeding/continue to trend Hgb  Narda Bonds 05/11/2016,7:03 AM

## 2016-05-11 NOTE — Progress Notes (Signed)
   05/11/16 1048  Clinical Encounter Type  Visited With Patient  Visit Type Spiritual support  Referral From Nurse  Spiritual Encounters  Spiritual Needs Prayer;Emotional  Stress Factors  Patient Stress Factors Health changes  Chaplain visit made, patient has pending testing, provided active listening, facilitated pt. storytelling, also provided emotional support and prayer.

## 2016-05-12 ENCOUNTER — Inpatient Hospital Stay (HOSPITAL_COMMUNITY): Payer: Medicaid - Out of State

## 2016-05-12 ENCOUNTER — Encounter (HOSPITAL_COMMUNITY): Payer: Self-pay | Admitting: Radiology

## 2016-05-12 DIAGNOSIS — J9601 Acute respiratory failure with hypoxia: Secondary | ICD-10-CM

## 2016-05-12 LAB — CBC
HCT: 37.3 % — ABNORMAL LOW (ref 39.0–52.0)
HEMOGLOBIN: 10.7 g/dL — AB (ref 13.0–17.0)
MCH: 23.4 pg — AB (ref 26.0–34.0)
MCHC: 28.7 g/dL — AB (ref 30.0–36.0)
MCV: 81.6 fL (ref 78.0–100.0)
Platelets: 564 10*3/uL — ABNORMAL HIGH (ref 150–400)
RBC: 4.57 MIL/uL (ref 4.22–5.81)
RDW: 19.5 % — AB (ref 11.5–15.5)
WBC: 12.8 10*3/uL — ABNORMAL HIGH (ref 4.0–10.5)

## 2016-05-12 MED ORDER — IOPAMIDOL (ISOVUE-300) INJECTION 61%
INTRAVENOUS | Status: AC
  Administered 2016-05-12: 100 mL
  Filled 2016-05-12: qty 100

## 2016-05-12 MED ORDER — ENOXAPARIN SODIUM 120 MG/0.8ML ~~LOC~~ SOLN: 120.0000 mg | SUBCUTANEOUS | Status: AC

## 2016-05-12 MED ORDER — OXYCODONE HCL ER 20 MG PO T12A: 20.0000 mg | EXTENDED_RELEASE_TABLET | Freq: Two times a day (BID) | ORAL | Status: DC

## 2016-05-12 MED ORDER — AMOXICILLIN-POT CLAVULANATE 875-125 MG PO TABS: 1.0000 | ORAL_TABLET | Freq: Two times a day (BID) | ORAL | Status: DC

## 2016-05-12 MED ORDER — DIATRIZOATE MEGLUMINE & SODIUM 66-10 % PO SOLN
15.0000 mL | ORAL | Status: AC
  Administered 2016-05-12 (×2): 15 mL via ORAL
  Filled 2016-05-12: qty 30

## 2016-05-12 NOTE — Progress Notes (Signed)
SATURATION QUALIFICATIONS: (This note is used to comply with regulatory documentation for home oxygen)  Patient Saturations on Room Air at Rest = 87  Patient Saturations on Room Air while Ambulating = didn't do it.  Patient Saturations on 3 Liters of oxygen while Ambulating = 93  Please briefly explain why patient needs home oxygen:  Patient short of breath at room air and was better with 3L of O2.

## 2016-05-12 NOTE — Progress Notes (Signed)
Patient Discharge: Disposition: Patient discharged to home accompanied by the sister. Education: reviewed medications, prescriptions, follow-up appointments and discharge instructions, understood and acknowledged. IV: Discontinue IV before discharge, Central line discontinued by the IV team. Telemetry: Discontinued before discharge, CCMD notified. Transportation: Patient escorted in w/c by the staff out of the unit. Belongings: patient took all his belongings with him.

## 2016-05-12 NOTE — Progress Notes (Signed)
   05/12/16 1200  Clinical Encounter Type  Visited With Patient and family together  Visit Type Follow-up  Referral From Nurse  Spiritual Encounters  Spiritual Needs Prayer;Emotional  Stress Factors  Patient Stress Factors Health changes  Family Stress Factors Health changes  Chaplain follow up visit made, provided encouragement, spiritual presence, emotional support and prayer for additional testing to be completed as favorable as possible. Will continue to follow up as needed.

## 2016-05-12 NOTE — Progress Notes (Addendum)
Patients sister has had teaching on giving Lovenox. Paper instructions were given as well. Patient sister has seen Lovenox been given. Jackson Latino, RN, Clinical Instructor

## 2016-05-12 NOTE — Progress Notes (Signed)
Pt lying in bed at 24 degrees. Instructed pt to hold breath upon pulling of line. Held pressure for 5 min. No s/sx of bleeding. Applied pressure drsg and instructed pt to remain in bed for 75min. Also instructed pt to leave drsg CDI for 24 hours before removing. Both pt and sister VU. Fran Lowes, RN VAST

## 2016-05-12 NOTE — Discharge Summary (Signed)
Physician Discharge Summary  Eric Villarreal [id number] DOB: 1956-03-01 DOA: 05/08/2016  PCP: Maricela Curet, MD  Admit date: 05/08/2016 Discharge date: 05/12/2016  Admitted From: Home Disposition:  Home  Recommendations for Outpatient Follow-up:  1. Follow up with PCP in 1-2 weeks 2. Please obtain BMP/CBC in one week 3. Please follow up on echo results   Home Health:No Equipment/Devices: 3L [place] oxygen  Discharge Condition:stable CODE STATUS:FULL Diet recommendation: Heart Healthy   Brief/Interim Summary 60 year old male with a history of COPD, CAD, hypertension, diabetes mellitus, hyperlipidemia presented with cough, hemoptysis and shortness of breath. The patient smokes 2 packs per day. Apparently, the patient also had abdominal pain and hematochezia. The patient recently saw his primary care provider who started him on levofloxacin for pneumonia. Unfortunately, his symptoms persisted and he presented to the ED at [facility] or CT angiogram of the chest found the patient to have bilateral pulmonary emboli. He was also noted to have a renal mass with metastatic disease to the lung. The patient was started on intravenous heparin and transferred to [facility]. The patient was initially admitted to the ICU. The patient remained hemodynamically stable, and he was transferred to [facility] floor on 05/09/16. [facility] assumed care on 05/10/16. Med Onc was consulted. Dr. Burr Medico saw the patient on 05/09/16 and recommended lung biopsy for definitive tissue diagnosis.  Discharge Diagnoses:  Acute pulmonary emboli/hemoptysis -Emboli burden is mild per radiology report -Intravenous heparin D#4-->[place] Lovenox -echo-results pending at time of dc -trend Hgb--stable -05/11/16--discussed with Med Onc, Dr. Marvis Moeller with Lovenox  IVC thrombus -noted on CT abd/pelvis (6/9) -05/11/16--discussed with Med Onc, Dr. Marvis Moeller with Lovenox 120mg  daily  Abdominal pain -05/12/16--pt felt it was intermittently  worse -05/12/16--repeat CT abd--Negative gallbladder, stable medial spleen lesion/mass, stable left renal mass, negative colon, negative small bowel, bulky retroperitoneal LN, IVC thrombus -likely due to IVC thrombus, and bulky LN -continue home dose oxycodone 15 mg q 6hrs prn -add OxyContin 20 mg bid, #60, no refill  Left renal mass -Concerning for renal cell carcinoma with metastasis to lung -Appreciate medical oncology consult--> lung biopsy if possible--see below  Acute respiratory failure with hypoxia/COPD -Stable without exacerbation -Continue Brovana and incruse during hospitalization -stable on 3 L -ambulatory pulse ox showed desaturation-->home with 3 L  Lung nodules -ask IR to attempt biopsy--discussed with Dr. 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     amoxicillin-clavulanate 875-125 MG tablet  Commonly known as:  AUGMENTIN  Take 1 tablet by mouth every 12 (twelve) hours.     ANORO ELLIPTA 62.5-25 MCG/INH Aepb  Generic drug:  umeclidinium-vilanterol  Inhale 1 puff into the lungs daily.     dapsone 100 MG tablet  Take 150 mg by mouth daily.     enoxaparin 120 MG/0.8ML injection  Commonly known as:  LOVENOX  Inject 0.8 mLs (120 mg total) into the skin daily.     LORazepam 1 MG tablet  Commonly known as:  ATIVAN  Take 1 mg by mouth at bedtime.     minocycline 100 MG capsule  Commonly known as:  MINOCIN,DYNACIN  Take 100 mg by mouth daily.     oxyCODONE 15 MG immediate release tablet  Commonly known as:  ROXICODONE  Take 15 mg by mouth 4 (four) times daily.     oxyCODONE 20 mg 12 hr tablet  Commonly known as:  OXYCONTIN  Take 1 tablet (20 mg total) by mouth every 12 (twelve) hours.       Follow-up Information    Follow up with Magdalen Spatz, NP On 05/25/2016.   Specialty:  Pulmonary Disease   Why:  Follow up with lung doctors at 11 am.   Contact information:   [address] [place] [address] [phone]      Allergies  Allergen Reactions  . Doxycycline Rash    Consultations:  PCCM   Procedures/Studies: Dg Chest 2 View  05/04/2016  CLINICAL DATA:  Cough hemoptysis EXAM: CHEST  2 VIEW COMPARISON:  None. FINDINGS: Cardiac shadow is within normal limits. The lungs are hyperinflated. Increased density is noted in the right upper lobe consistent with acute infiltrate. Multiple pulmonary nodules are noted bilaterally consistent with metastatic disease. Further evaluation by means of CT of the chest with contrast is recommended. IMPRESSION: COPD. Right upper lobe infiltrate. Multiple pulmonary nodules consistent with metastatic disease. CT of the chest is recommended for further evaluation. These results will be called to the ordering clinician or representative by the Radiologist Assistant, and communication documented in the PACS or zVision Dashboard. Electronically Signed   By: Inez Catalina M.D.   On: 05/04/2016 22:10   Ct Angio Chest Pe W/cm &/or Wo Cm  05/08/2016  CLINICAL DATA:   Spitting up blood. Blood in stool. Weakness. Angina. EXAM: CT ANGIOGRAPHY CHEST CT ABDOMEN AND PELVIS WITH CONTRAST TECHNIQUE: Multidetector CT imaging of the chest was performed using the standard protocol during bolus administration of intravenous contrast. Multiplanar CT image reconstructions and MIPs were obtained to evaluate the vascular anatomy. Multidetector CT imaging of the abdomen and pelvis was performed using the standard protocol during bolus administration of intravenous contrast. CONTRAST:  100 mL of Isovue 370 COMPARISON:  None FINDINGS: CTA CHEST FINDINGS The central airways are normal. Evaluation of the lungs is somewhat limited due to respiratory motion. There is widespread infiltrate in the right upper lobe consistent with pneumonia. There are innumerable nodules throughout both lungs consistent with metastatic disease. A representative nodule posteriorly in the right lung base on series 10, image 123 measures 2.7 x 2.1 cm. There is enlarged lymph node in the right hilum on series 8, image 81 measuring 3.9 by 3.4 cm. There is a prominent subcarinal node. Mildly prominent nodes seen in the mediastinum. The thoracic aorta measures 4.2 cm improved. No dissection. No pleural or pericardial effusions. There are coronary artery calcifications. The study is positive for pulmonary emboli most prominent in the right upper and right middle lobes. The embolus burden is relatively mild. No convincing evidence of heart strain. CT ABDOMEN and PELVIS FINDINGS No free air. The patient is  cachectic and there is evidence of volume load with increased attenuation in the subcutaneous and abdominal fat. There is a large mass in the left kidney measuring 8.8 by 7.8 by 9.9 cm, centered in the lower pole. There is no extension of tumor into the peripheral left renal vein. There is extensive thrombus in the IVC, probably extending inferiorly into the iliac vessels. The thrombus extends to the level of the renal vein  drainage. There is at least 1 small filling defect in the central left renal vein but there is no definitive connection to the left renal mass. The defect in the left renal vein is best seen on coronal image 59. The right kidney is normal. The intrahepatic portions of the portal vein are patent. There does appear to be thrombus within the confluence of the splenic and superior mesenteric veins best seen on coronal image 43. The attenuation of the liver is somewhat heterogeneous but no discrete metastases are seen. The gallbladder is unremarkable. There is a mass in the medial spleen on axial image 18. While nonspecific, a metastatic lesion is possible measuring up to 2.6 cm. The right adrenal gland and pancreas are normal. No discrete nodules seen in the left adrenal gland. There are multiple abnormal lymph nodes in the left side of the retroperitoneum between the kidney in the aorta. A representative node to the left of the aorta on axial image 31 and measures 2.6 by 2.1 cm. The abdominal aorta is normal in caliber. The stomach is poorly evaluated but grossly unremarkable. The small bowel is nonobstructed. The colon is normal. The appendix not visualized but there is no evidence of appendicitis. The pelvis demonstrates probable extension of thrombus into the iliac vessels. Pelvic loops of bowel are unremarkable. The bladder is thick-walled but otherwise unremarkable. No discrete adenopathy. No filling defects in the renal collecting systems on delayed images. No bony metastatic disease. Review of the MIP images confirms the above findings. IMPRESSION: 1. The study is positive for pulmonary emboli. There is extensive thrombus in the IVC extending inferiorly into the iliac veins. There is a small amount of thrombus in the left renal vein as well as a small amount of thrombus in the splenic and superior mesenteric veins. 2. 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 A mass in the spleen is nonspecific but could represent a metastatic lesion. 3. Right upper lobe pneumonia. 4. 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 Findings called to Dr. Noemi Chapel Electronically Signed   By: Dorise Bullion III M.D   On: 05/08/2016 19:35   Ct Abdomen Pelvis W Contrast  05/12/2016  CLINICAL DATA:  Worsening diffuse abdominal and low back pain. EXAM: CT ABDOMEN AND PELVIS WITH CONTRAST TECHNIQUE: Multidetector CT imaging of the abdomen and pelvis was performed using the standard protocol following bolus administration of intravenous contrast. CONTRAST:  125mL ISOVUE-300 IOPAMIDOL (ISOVUE-300) INJECTION 61% COMPARISON:  05/08/2016 FINDINGS: Lower chest: Innumerable pulmonary nodules are seen in the lung bases. Hepatobiliary: No focal abnormality within the liver parenchyma. There is no evidence for gallstones, gallbladder wall thickening, or pericholecystic fluid. No intrahepatic or extrahepatic biliary dilation. Pancreas: No focal mass lesion. No dilatation of the main duct. No intraparenchymal cyst. No peripancreatic edema. Spleen: 2.8 cm low-density lesion identified in the medial spleen, as before. Adrenals/Urinary Tract: The adrenal gland is unremarkable. Left adrenal gland not well seen. Tiny hypodensity in the interpolar right kidney is stable. 8.4 x  9.5 x 9.3 cm heterogeneous mass again noted in the lower pole of the left kidney. No evidence for hydroureteronephrosis. The urinary bladder appears normal for the degree of distention. Stomach/Bowel: Stomach is nondistended. No gastric wall thickening. No evidence of outlet obstruction. Duodenum is normally positioned as is the ligament of Treitz. No small bowel wall thickening. No small bowel dilatation. No gross colonic mass. No colonic wall thickening. No substantial diverticular change. Vascular/Lymphatic: There is abdominal aortic atherosclerosis without aneurysm. Bulky retroperitoneal  lymphadenopathy again noted. 2.1 x 2.6 cm left para-aortic lymph node measured previously is now 2.3 x 3.1 cm. As noted on the prior study, there is occlusive thrombus in the IVC, extending from the level of the renal veins caudally into the common iliac and external iliac veins bilaterally. Reproductive: The prostate gland and seminal vesicles have normal imaging features. Other: Small amount of intraperitoneal free fluid is identified. There is diffuse body wall edema throughout. Musculoskeletal: Soft tissue lesion obliterates the T9 spinous process (see image 3 series 2). IMPRESSION: 1. Large left renal mass consistent with renal cell carcinoma. There is bulky associated retroperitoneal metastatic lymphadenopathy within nodules identified in the lower lungs, also consistent with metastatic involvement. Low-density lesion in the medial spleen is suspicious for splenic metastasis. 2. 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 3. Metastatic destructive lesion at T9 spinous process. No evidence for epidural tumor involvement at this level. 4. Small volume intraperitoneal free fluid with diffuse body wall edema. Electronically Signed   By: Misty Stanley M.D.   On: 05/12/2016 13:51   Ct Abdomen Pelvis W Contrast  05/08/2016  CLINICAL DATA:  Spitting up blood. Blood in stool. Weakness. Angina. EXAM: CT ANGIOGRAPHY CHEST CT ABDOMEN AND PELVIS WITH CONTRAST TECHNIQUE: Multidetector CT imaging of the chest was performed using the standard protocol during bolus administration of intravenous contrast. Multiplanar CT image reconstructions and MIPs were obtained to evaluate the vascular anatomy. Multidetector CT imaging of the abdomen and pelvis was performed using the standard protocol during bolus administration of intravenous contrast. CONTRAST:  100 mL of Isovue 370 COMPARISON:  None FINDINGS: CTA CHEST FINDINGS The central airways are normal. Evaluation of the lungs is  somewhat limited due to respiratory motion. There is widespread infiltrate in the right upper lobe consistent with pneumonia. There are innumerable nodules throughout both lungs consistent with metastatic disease. A representative nodule posteriorly in the right lung base on series 10, image 123 measures 2.7 x 2.1 cm. There is enlarged lymph node in the right hilum on series 8, image 81 measuring 3.9 by 3.4 cm. There is a prominent subcarinal node. Mildly prominent nodes seen in the mediastinum. The thoracic aorta measures 4.2 cm improved. No dissection. No pleural or pericardial effusions. There are coronary artery calcifications. The study is positive for pulmonary emboli most prominent in the right upper and right middle lobes. The embolus burden is relatively mild. No convincing evidence of heart strain. CT ABDOMEN and PELVIS FINDINGS No free air. The patient is cachectic and there is evidence of volume load with increased attenuation in the subcutaneous and abdominal fat. There is a large mass in the left kidney measuring 8.8 by 7.8 by 9.9 cm, centered in the lower pole. There is no extension of tumor into the peripheral left renal vein. There is extensive thrombus in the IVC, probably extending inferiorly into the iliac vessels. The thrombus extends to the level of the renal vein drainage. There is at least 1  small filling defect in the central left renal vein but there is no definitive connection to the left renal mass. The defect in the left renal vein is best seen on coronal image 59. The right kidney is normal. The intrahepatic portions of the portal vein are patent. There does appear to be thrombus within the confluence of the splenic and superior mesenteric veins best seen on coronal image 43. The attenuation of the liver is somewhat heterogeneous but no discrete metastases are seen. The gallbladder is unremarkable. There is a mass in the medial spleen on axial image 18. While nonspecific, a metastatic  lesion is possible measuring up to 2.6 cm. The right adrenal gland and pancreas are normal. No discrete nodules seen in the left adrenal gland. There are multiple abnormal lymph nodes in the left side of the retroperitoneum between the kidney in the aorta. A representative node to the left of the aorta on axial image 31 and measures 2.6 by 2.1 cm. The abdominal aorta is normal in caliber. The stomach is poorly evaluated but grossly unremarkable. The small bowel is nonobstructed. The colon is normal. The appendix not visualized but there is no evidence of appendicitis. The pelvis demonstrates probable extension of thrombus into the iliac vessels. Pelvic loops of bowel are unremarkable. The bladder is thick-walled but otherwise unremarkable. No discrete adenopathy. No filling defects in the renal collecting systems on delayed images. No bony metastatic disease. Review of the MIP images confirms the above findings. IMPRESSION: 1. The study is positive for pulmonary emboli. There is extensive thrombus in the IVC extending inferiorly into the iliac veins. There is a small amount of thrombus in the left renal vein as well as a small amount of thrombus in the splenic and superior mesenteric veins. 2. 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 A mass in the spleen is nonspecific but could represent a metastatic lesion. 3. Right upper lobe pneumonia. 4. 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 Findings called to Dr. Noemi Chapel Electronically Signed   By: Dorise Bullion III M.D   On: 05/08/2016 19:35   Dg Chest Port 1 View  05/10/2016  CLINICAL DATA:  Acute respiratory failure, hypoxemia EXAM: PORTABLE CHEST 1 VIEW COMPARISON:  05/08/2016 FINDINGS: Cardiomediastinal silhouette is stable. Right IJ central line is stable in position. Persistent patchy infiltrate/ pneumonia right upper lobe. Bilateral pulmonary nodules are again  noted. New streaky bilateral basilar atelectasis or infiltrate. IMPRESSION: Persistent bilateral pulmonary nodules consistent with metastatic disease. Persistent patchy infiltrate/pneumonia in right upper lobe. New streaky bilateral basilar atelectasis or infiltrate. Right IJ central line is unchanged in position. Electronically Signed   By: Lahoma Crocker M.D.   On: 05/10/2016 09:22   Dg Chest Portable 1 View  05/08/2016  CLINICAL DATA:  60 year old male with central line placement. Renal cell carcinoma with metastasis to the lung. EXAM: PORTABLE CHEST 1 VIEW COMPARISON:  Chest CT dated 05/09/2016 FINDINGS: Right IJ central line with tip over central SVC. There is no pneumothorax. There is emphysematous changes of lungs. A patchy area of airspace opacity in the right upper lobe as seen on the prior study most likely pneumonia. There are multiple scattered pulmonary nodules as seen on the prior CT most compatible with metastatic disease. There is no pleural effusion. The cardiac silhouette is within normal limits. No acute osseous pathology. IMPRESSION: Right IJ central line with tip over central SVC.  No pneumothorax. Emphysema with right  upper lobe infiltrate as well as multiple pulmonary nodules/metastatic disease. These findings are better seen on the earlier CT. Electronically Signed   By: Anner Crete M.D.   On: 05/08/2016 22:16      Discharge Exam: Filed Vitals:   05/12/16 0957 05/12/16 1715  BP: 114/66 108/68  Pulse:  79  Temp:  97.7 F (36.5 C)  Resp: 19 20   Filed Vitals:   05/12/16 1509 05/12/16 1515 05/12/16 1522 05/12/16 1715  BP:    108/68  Pulse:    79  Temp:    97.7 F (36.5 C)  TempSrc:    Oral  Resp:    20  Height:      Weight:      SpO2: 92% 87% 93% 97%    General: Pt is alert, awake, not in acute distress Cardiovascular: RRR, S1/S2 +, no rubs, no gallops Respiratory: Bibasilar rales. No wheezing Extremities: no edema, no cyanosis   The results of significant  diagnostics from this hospitalization (including imaging, microbiology, ancillary and laboratory) are listed below for reference.    Significant Diagnostic Studies: Dg Chest 2 View  05/04/2016  CLINICAL DATA:  Cough hemoptysis EXAM: CHEST  2 VIEW COMPARISON:  None. FINDINGS: Cardiac shadow is within normal limits. The lungs are hyperinflated. Increased density is noted in the right upper lobe consistent with acute infiltrate. Multiple pulmonary nodules are noted bilaterally consistent with metastatic disease. Further evaluation by means of CT of the chest with contrast is recommended. IMPRESSION: COPD. Right upper lobe infiltrate. Multiple pulmonary nodules consistent with metastatic disease. CT of the chest is recommended for further evaluation. These results will be called to the ordering clinician or representative by the Radiologist Assistant, and communication documented in the PACS or zVision Dashboard. Electronically Signed   By: Inez Catalina M.D.   On: 05/04/2016 22:10   Ct Angio Chest Pe W/cm &/or Wo Cm  05/08/2016  CLINICAL DATA:  Spitting up blood. Blood in stool. Weakness. Angina. EXAM: CT ANGIOGRAPHY CHEST CT ABDOMEN AND PELVIS WITH CONTRAST TECHNIQUE: Multidetector CT imaging of the chest was performed using the standard protocol during bolus administration of intravenous contrast. Multiplanar CT image reconstructions and MIPs were obtained to evaluate the vascular anatomy. Multidetector CT imaging of the abdomen and pelvis was performed using the standard protocol during bolus administration of intravenous contrast. CONTRAST:  100 mL of Isovue 370 COMPARISON:  None FINDINGS: CTA CHEST FINDINGS The central airways are normal. Evaluation of the lungs is somewhat limited due to respiratory motion. There is widespread infiltrate in the right upper lobe consistent with pneumonia. There are innumerable nodules throughout both lungs consistent with metastatic disease. A representative nodule posteriorly  in the right lung base on series 10, image 123 measures 2.7 x 2.1 cm. There is enlarged lymph node in the right hilum on series 8, image 81 measuring 3.9 by 3.4 cm. There is a prominent subcarinal node. Mildly prominent nodes seen in the mediastinum. The thoracic aorta measures 4.2 cm improved. No dissection. No pleural or pericardial effusions. There are coronary artery calcifications. The study is positive for pulmonary emboli most prominent in the right upper and right middle lobes. The embolus burden is relatively mild. No convincing evidence of heart strain. CT ABDOMEN and PELVIS FINDINGS No free air. The patient is cachectic and there is evidence of volume load with increased attenuation in the subcutaneous and abdominal fat. There is a large mass in the left kidney measuring 8.8 by 7.8 by 9.9 cm,  centered in the lower pole. There is no extension of tumor into the peripheral left renal vein. There is extensive thrombus in the IVC, probably extending inferiorly into the iliac vessels. The thrombus extends to the level of the renal vein drainage. There is at least 1 small filling defect in the central left renal vein but there is no definitive connection to the left renal mass. The defect in the left renal vein is best seen on coronal image 59. The right kidney is normal. The intrahepatic portions of the portal vein are patent. There does appear to be thrombus within the confluence of the splenic and superior mesenteric veins best seen on coronal image 43. The attenuation of the liver is somewhat heterogeneous but no discrete metastases are seen. The gallbladder is unremarkable. There is a mass in the medial spleen on axial image 18. While nonspecific, a metastatic lesion is possible measuring up to 2.6 cm. The right adrenal gland and pancreas are normal. No discrete nodules seen in the left adrenal gland. There are multiple abnormal lymph nodes in the left side of the retroperitoneum between the kidney in the  aorta. A representative node to the left of the aorta on axial image 31 and measures 2.6 by 2.1 cm. The abdominal aorta is normal in caliber. The stomach is poorly evaluated but grossly unremarkable. The small bowel is nonobstructed. The colon is normal. The appendix not visualized but there is no evidence of appendicitis. The pelvis demonstrates probable extension of thrombus into the iliac vessels. Pelvic loops of bowel are unremarkable. The bladder is thick-walled but otherwise unremarkable. No discrete adenopathy. No filling defects in the renal collecting systems on delayed images. No bony metastatic disease. Review of the MIP images confirms the above findings. IMPRESSION: 1. The study is positive for pulmonary emboli. There is extensive thrombus in the IVC extending inferiorly into the iliac veins. There is a small amount of thrombus in the left renal vein as well as a small amount of thrombus in the splenic and superior mesenteric veins. 2. 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 A mass in the spleen is nonspecific but could represent a metastatic lesion. 3. Right upper lobe pneumonia. 4. 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 Findings called to Dr. Noemi Chapel Electronically Signed   By: Dorise Bullion III M.D   On: 05/08/2016 19:35   Ct Abdomen Pelvis W Contrast  05/12/2016  CLINICAL DATA:  Worsening diffuse abdominal and low back pain. EXAM: CT ABDOMEN AND PELVIS WITH CONTRAST TECHNIQUE: Multidetector CT imaging of the abdomen and pelvis was performed using the standard protocol following bolus administration of intravenous contrast. CONTRAST:  134mL ISOVUE-300 IOPAMIDOL (ISOVUE-300) INJECTION 61% COMPARISON:  05/08/2016 FINDINGS: Lower chest: Innumerable pulmonary nodules are seen in the lung bases. Hepatobiliary: No focal abnormality within the liver parenchyma. There is no evidence for gallstones,  gallbladder wall thickening, or pericholecystic fluid. No intrahepatic or extrahepatic biliary dilation. Pancreas: No focal mass lesion. No dilatation of the main duct. No intraparenchymal cyst. No peripancreatic edema. Spleen: 2.8 cm low-density lesion identified in the medial spleen, as before. Adrenals/Urinary Tract: The adrenal gland is unremarkable. Left adrenal gland not well seen. Tiny hypodensity in the interpolar right kidney is stable. 8.4 x 9.5 x 9.3 cm heterogeneous mass again noted in the lower pole of the left kidney. No evidence for hydroureteronephrosis. The urinary bladder appears normal for the degree of distention. Stomach/Bowel: Stomach is  nondistended. No gastric wall thickening. No evidence of outlet obstruction. Duodenum is normally positioned as is the ligament of Treitz. No small bowel wall thickening. No small bowel dilatation. No gross colonic mass. No colonic wall thickening. No substantial diverticular change. Vascular/Lymphatic: There is abdominal aortic atherosclerosis without aneurysm. Bulky retroperitoneal lymphadenopathy again noted. 2.1 x 2.6 cm left para-aortic lymph node measured previously is now 2.3 x 3.1 cm. As noted on the prior study, there is occlusive thrombus in the IVC, extending from the level of the renal veins caudally into the common iliac and external iliac veins bilaterally. Reproductive: The prostate gland and seminal vesicles have normal imaging features. Other: Small amount of intraperitoneal free fluid is identified. There is diffuse body wall edema throughout. Musculoskeletal: Soft tissue lesion obliterates the T9 spinous process (see image 3 series 2). IMPRESSION: 1. Large left renal mass consistent with renal cell carcinoma. There is bulky associated retroperitoneal metastatic lymphadenopathy within nodules identified in the lower lungs, also consistent with metastatic involvement. Low-density lesion in the medial spleen is suspicious for splenic metastasis.  2. 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 3. Metastatic destructive lesion at T9 spinous process. No evidence for epidural tumor involvement at this level. 4. Small volume intraperitoneal free fluid with diffuse body wall edema. Electronically Signed   By: Misty Stanley M.D.   On: 05/12/2016 13:51   Ct Abdomen Pelvis W Contrast  05/08/2016  CLINICAL DATA:  Spitting up blood. Blood in stool. Weakness. Angina. EXAM: CT ANGIOGRAPHY CHEST CT ABDOMEN AND PELVIS WITH CONTRAST TECHNIQUE: Multidetector CT imaging of the chest was performed using the standard protocol during bolus administration of intravenous contrast. Multiplanar CT image reconstructions and MIPs were obtained to evaluate the vascular anatomy. Multidetector CT imaging of the abdomen and pelvis was performed using the standard protocol during bolus administration of intravenous contrast. CONTRAST:  100 mL of Isovue 370 COMPARISON:  None FINDINGS: CTA CHEST FINDINGS The central airways are normal. Evaluation of the lungs is somewhat limited due to respiratory motion. There is widespread infiltrate in the right upper lobe consistent with pneumonia. There are innumerable nodules throughout both lungs consistent with metastatic disease. A representative nodule posteriorly in the right lung base on series 10, image 123 measures 2.7 x 2.1 cm. There is enlarged lymph node in the right hilum on series 8, image 81 measuring 3.9 by 3.4 cm. There is a prominent subcarinal node. Mildly prominent nodes seen in the mediastinum. The thoracic aorta measures 4.2 cm improved. No dissection. No pleural or pericardial effusions. There are coronary artery calcifications. The study is positive for pulmonary emboli most prominent in the right upper and right middle lobes. The embolus burden is relatively mild. No convincing evidence of heart strain. CT ABDOMEN and PELVIS FINDINGS No free air. The patient is cachectic and there  is evidence of volume load with increased attenuation in the subcutaneous and abdominal fat. There is a large mass in the left kidney measuring 8.8 by 7.8 by 9.9 cm, centered in the lower pole. There is no extension of tumor into the peripheral left renal vein. There is extensive thrombus in the IVC, probably extending inferiorly into the iliac vessels. The thrombus extends to the level of the renal vein drainage. There is at least 1 small filling defect in the central left renal vein but there is no definitive connection to the left renal mass. The defect in the left renal vein is best seen on coronal image  59. The right kidney is normal. The intrahepatic portions of the portal vein are patent. There does appear to be thrombus within the confluence of the splenic and superior mesenteric veins best seen on coronal image 43. The attenuation of the liver is somewhat heterogeneous but no discrete metastases are seen. The gallbladder is unremarkable. There is a mass in the medial spleen on axial image 18. While nonspecific, a metastatic lesion is possible measuring up to 2.6 cm. The right adrenal gland and pancreas are normal. No discrete nodules seen in the left adrenal gland. There are multiple abnormal lymph nodes in the left side of the retroperitoneum between the kidney in the aorta. A representative node to the left of the aorta on axial image 31 and measures 2.6 by 2.1 cm. The abdominal aorta is normal in caliber. The stomach is poorly evaluated but grossly unremarkable. The small bowel is nonobstructed. The colon is normal. The appendix not visualized but there is no evidence of appendicitis. The pelvis demonstrates probable extension of thrombus into the iliac vessels. Pelvic loops of bowel are unremarkable. The bladder is thick-walled but otherwise unremarkable. No discrete adenopathy. No filling defects in the renal collecting systems on delayed images. No bony metastatic disease. Review of the MIP images  confirms the above findings. IMPRESSION: 1. The study is positive for pulmonary emboli. There is extensive thrombus in the IVC extending inferiorly into the iliac veins. There is a small amount of thrombus in the left renal vein as well as a small amount of thrombus in the splenic and superior mesenteric veins. 2. 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 A mass in the spleen is nonspecific but could represent a metastatic lesion. 3. Right upper lobe pneumonia. 4. 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 Findings called to Dr. Noemi Chapel Electronically Signed   By: Dorise Bullion III M.D   On: 05/08/2016 19:35   Dg Chest Port 1 View  05/10/2016  CLINICAL DATA:  Acute respiratory failure, hypoxemia EXAM: PORTABLE CHEST 1 VIEW COMPARISON:  05/08/2016 FINDINGS: Cardiomediastinal silhouette is stable. Right IJ central line is stable in position. Persistent patchy infiltrate/ pneumonia right upper lobe. Bilateral pulmonary nodules are again noted. New streaky bilateral basilar atelectasis or infiltrate. IMPRESSION: Persistent bilateral pulmonary nodules consistent with metastatic disease. Persistent patchy infiltrate/pneumonia in right upper lobe. New streaky bilateral basilar atelectasis or infiltrate. Right IJ central line is unchanged in position. Electronically Signed   By: Lahoma Crocker M.D.   On: 05/10/2016 09:22   Dg Chest Portable 1 View  05/08/2016  CLINICAL DATA:  60 year old male with central line placement. Renal cell carcinoma with metastasis to the lung. EXAM: PORTABLE CHEST 1 VIEW COMPARISON:  Chest CT dated 05/09/2016 FINDINGS: Right IJ central line with tip over central SVC. There is no pneumothorax. There is emphysematous changes of lungs. A patchy area of airspace opacity in the right upper lobe as seen on the prior study most likely pneumonia. There are multiple scattered pulmonary nodules as seen on the  prior CT most compatible with metastatic disease. There is no pleural effusion. The cardiac silhouette is within normal limits. No acute osseous pathology. IMPRESSION: Right IJ central line with tip over central SVC.  No pneumothorax. Emphysema with right upper lobe infiltrate as well as multiple pulmonary nodules/metastatic disease. These findings are better seen on the earlier CT. Electronically Signed   By: Anner Crete M.D.   On: 05/08/2016 22:16  Microbiology: Recent Results (from the past 240 hour(s))  Blood culture (routine x 2)     Status: None (Preliminary result)   Collection Time: 05/08/16  5:04 PM  Result Value Ref Range Status   Specimen Description BLOOD LEFT ARM DRAWN BY RN  Final   Special Requests BOTTLES DRAWN AEROBIC AND ANAEROBIC 6CC EACH  Final   Culture NO GROWTH 4 DAYS  Final   Report Status PENDING  Incomplete  Blood culture (routine x 2)     Status: None (Preliminary result)   Collection Time: 05/08/16  5:07 PM  Result Value Ref Range Status   Specimen Description BLOOD RIGHT ANTECUBITAL  Final   Special Requests BOTTLES DRAWN AEROBIC AND ANAEROBIC 8CC EACH  Final   Culture NO GROWTH 4 DAYS  Final   Report Status PENDING  Incomplete  Urine culture     Status: Abnormal   Collection Time: 05/08/16  5:55 PM  Result Value Ref Range Status   Specimen Description URINE, CLEAN CATCH  Final   Special Requests NONE  Final   Culture (A)  Final    <10,000 COLONIES/mL INSIGNIFICANT GROWTH Performed at [facility]    Report Status 05/09/2016 FINAL  Final  MRSA PCR Screening     Status: None   Collection Time: 05/08/16 11:30 PM  Result Value Ref Range Status   MRSA by PCR NEGATIVE NEGATIVE Final    Comment:        The GeneXpert MRSA Assay (FDA approved for NASAL specimens only), is one component of a comprehensive MRSA colonization surveillance program. It is not intended to diagnose MRSA infection nor to guide or monitor treatment for MRSA  infections. Performed at [place]: Basic Metabolic Panel:  Recent Labs Lab 05/08/16 1641 05/09/16 0140 05/10/16 0310 05/11/16 0545  NA 130* 134* 134* 134*  K 4.3 4.2 4.3 4.4  CL 93* 100* 100* 100*  CO2 28 28 28 27   GLUCOSE 101* 89 123* 87  BUN 28* 18 13 9   CREATININE 1.11 0.85 0.82 0.68  CALCIUM 10.2 9.2 9.2 9.2  MG  --  1.7  --   --   PHOS  --  3.4  --   --    Liver Function Tests:  Recent Labs Lab 05/08/16 1641 05/09/16 0140  AST 17 14*  ALT 12* 11*  ALKPHOS 179* 145*  BILITOT 1.0 1.0  PROT 7.3 5.3*  ALBUMIN 2.5* 1.8*    Recent Labs Lab 05/09/16 0140  LIPASE 18   No results for input(s): AMMONIA in the last 168 hours. CBC:  Recent Labs Lab 05/08/16 1641 05/10/16 0325 05/11/16 0545 05/12/16 0500  WBC 16.1* 11.9* 12.8* 12.8*  NEUTROABS 13.6*  --   --   --   HGB 15.1 11.2* 11.5* 10.7*  HCT 46.9 37.8* 38.8* 37.3*  MCV 79.6 80.1 82.4 81.6  PLT 396 413* 470* 564*   Cardiac Enzymes:  Recent Labs Lab 05/09/16 0140 05/09/16 0700 05/09/16 1850  TROPONINI 0.03 <0.03 0.33*   BNP: Invalid input(s): POCBNP CBG:  Recent Labs Lab 05/08/16 2216 05/09/16 0007 05/09/16 0336  GLUCAP 127* 92 86    Time coordinating discharge:  Greater than 30 minutes  Signed:  Antwonette Feliz, DO [facility] Pager: [phone] 05/12/2016, 6:08 PM

## 2016-05-12 NOTE — Care Management Note (Addendum)
Case Management Note  Patient Details  Name: KAIMANA LURZ MRN: [id number] Date of Birth: 12-Dec-1955  Subjective/Objective:     CM following for progression and d/c planning. Pt lives in [place], [place] from [place]. Pt sister is supportive and able to assist.                Action/Plan: 05/12/2016 Met with pt re d/c needs. Pt states that he has a pharmacy that assist with his meds and that he will be able to get his meds as ordered. Pt has [place]. Will follow for possible home oxygen needs. Pt states that his uncle is on oxygen and he may speak with the family to learn the name of his oxygen provider. Pt states that he does not need a walker. Will await orders.  4:20pm Orders for home oxygen faxed to [facility] in [place], they will bring tank for the pt to go home and pass orders on the [facility] in [place], [place] to provide home oxygen supplies as pt lives 10 miles from [place]. Pt and sister informed.  Confirmed that [facility] has received all orders as needed.   Expected Discharge Date:      05/12/2016            Expected Discharge Plan:  [place]  In-House Referral:  NA  Discharge planning Services  CM Consult  Post Acute Care Choice:  Home Health, Durable Medical Equipment Choice offered to:  Patient  DME Arranged:   home Oxygen DME Agency:   [facility]  HH Arranged:   NA HH Agency:   NA  Status of Service:  In process, will continue to follow  Medicare Important Message Given:    Date Medicare IM Given:    Medicare IM give by:    Date Additional Medicare IM Given:    Additional Medicare Important Message give by:     If discussed at [place] of Stay Meetings, dates discussed:    Additional Comments:  Adron Bene, RN 05/12/2016, 12:07 PM

## 2016-05-13 LAB — CULTURE, BLOOD (ROUTINE X 2)
CULTURE: NO GROWTH
CULTURE: NO GROWTH

## 2016-05-14 NOTE — Progress Notes (Signed)
Unit CN received a call from patient stating that he had received a d/c prescription for oxycodone on 05/12/16 and he has been unable to get it filled. States the pharmacy told him they needed authorization in order to fill it. I called the [facility] office who stated they had sent out the information and they were waiting on Dr. Clementeen Graham to fill out the necessary paperwork. Dr. Clementeen Graham informed and reminded to complete asap. Patient called by this RN and informed of the above.   Joellen Jersey, RN.

## 2016-05-25 ENCOUNTER — Ambulatory Visit (HOSPITAL_COMMUNITY): Payer: Medicaid - Out of State | Admitting: Hematology & Oncology

## 2016-05-25 ENCOUNTER — Encounter: Payer: Self-pay | Admitting: Acute Care

## 2016-05-25 ENCOUNTER — Other Ambulatory Visit (INDEPENDENT_AMBULATORY_CARE_PROVIDER_SITE_OTHER): Payer: Self-pay

## 2016-05-25 ENCOUNTER — Ambulatory Visit (INDEPENDENT_AMBULATORY_CARE_PROVIDER_SITE_OTHER)
Admission: RE | Admit: 2016-05-25 | Discharge: 2016-05-25 | Disposition: A | Discharge status: Home / Self Care | Payer: Self-pay | Source: Ambulatory Visit | Attending: Acute Care | Admitting: Acute Care

## 2016-05-25 ENCOUNTER — Ambulatory Visit (INDEPENDENT_AMBULATORY_CARE_PROVIDER_SITE_OTHER): Payer: Self-pay | Admitting: Acute Care

## 2016-05-25 VITALS — BP 100/64 | HR 76 | Temp 97.6°F | Ht 76.0 in | Wt 163.0 lb

## 2016-05-25 DIAGNOSIS — R042 Hemoptysis: Secondary | ICD-10-CM

## 2016-05-25 DIAGNOSIS — I2699 Other pulmonary embolism without acute cor pulmonale: Secondary | ICD-10-CM

## 2016-05-25 DIAGNOSIS — R0600 Dyspnea, unspecified: Secondary | ICD-10-CM

## 2016-05-25 LAB — BASIC METABOLIC PANEL
BUN: 14 mg/dL (ref 6–23)
CALCIUM: 11.7 mg/dL — AB (ref 8.4–10.5)
CO2: 35 meq/L — AB (ref 19–32)
Chloride: 97 mEq/L (ref 96–112)
Creatinine, Ser: 1.01 mg/dL (ref 0.40–1.50)
GFR: 80.15 mL/min (ref >60.00)
Glucose, Bld: 77 mg/dL (ref 70–99)
Potassium: 4.6 mEq/L (ref 3.5–5.1)
SODIUM: 135 meq/L (ref 135–145)

## 2016-05-25 LAB — CBC WITH DIFFERENTIAL/PLATELET
Basophils Absolute: 0 10*3/uL (ref 0.0–0.1)
Basophils Relative: 0.4 % (ref 0.0–3.0)
EOS ABS: 0.2 10*3/uL (ref 0.0–0.7)
EOS PCT: 2 % (ref 0.0–5.0)
HCT: 42.3 % (ref 39.0–52.0)
Hemoglobin: 13.1 g/dL (ref 13.0–17.0)
LYMPHS ABS: 1.5 10*3/uL (ref 0.7–4.0)
Lymphocytes Relative: 14.9 % (ref 12.0–46.0)
MCHC: 31.1 g/dL (ref 30.0–36.0)
MCV: 82.1 fl (ref 78.0–100.0)
MONO ABS: 0.8 10*3/uL (ref 0.1–1.0)
Monocytes Relative: 7.6 % (ref 3.0–12.0)
NEUTROS PCT: 75.1 % (ref 43.0–77.0)
Neutro Abs: 7.5 10*3/uL (ref 1.4–7.7)
Platelets: 466 10*3/uL — ABNORMAL HIGH (ref 150.0–400.0)
RBC: 5.16 Mil/uL (ref 4.22–5.81)
RDW: 24.4 % — ABNORMAL HIGH (ref 11.5–15.5)
WBC: 10 10*3/uL (ref 4.0–10.5)

## 2016-05-25 MED ORDER — AMOXICILLIN-POT CLAVULANATE 875-125 MG PO TABS: 1.0000 | ORAL_TABLET | Freq: Two times a day (BID) | ORAL | Status: AC

## 2016-05-25 NOTE — Progress Notes (Signed)
History of Present Illness Eric Villarreal is a 60 y.o. male with history of COPD and suspected renal cancer that is highly suspicious for metastasis to his lungs ( Tissue diagnosis is needed).   6/26/2017Hospital Follow Up: Pt. Presents for follow up of recent hospitalization for Pulmonary Embolism, and pneumonia.He has completed the Augmentin he was prescribed at discharge. He is compliant with his Lovenox every day. We discussed that he is at risk for bleeding due to his blood thinners, and precautions he needs to take. He states he is feeling better. His shortness of breath is better.He denies chest pain, fever, orthopnea or hemoptysis.He states that he is rarely smoking at present. I have given him the number for free nicotine patches. He states he is using vapor cigarettes to help him to quit the tobacco. We discussed his cancer diagnosis, and the need for a biopsy. He is aware we have to time this carefully with his Lovenox treatment for his PE. He told me he has been losing weight for awhile and did not know why.He is using his oxygen at [address] [place] continuously.His CXR today indicates consolidation in the right upper lobe compatible with pneumonia. Suspect increasing cavitation since prior study on 05/10/16. I cannot find evidence in EPIC of a 2 D Echo being done prior to discharge from the hospital.  Significant Data  Admit date: 05/08/2016 Discharge date: 05/12/2016  Admitted From: Home Disposition: Home  Recommendations for Outpatient Follow-up:  1. Follow up with PCP in 1-2 weeks 2. Please obtain BMP/CBC in one week 3. Please follow up on echo results    Results for MINTON, RAVENSCROFT (MRN [id number]) as of 05/25/2016 12:35  Ref. Range 05/25/2016 13:56  Sodium Latest Ref Range: 135-145 mEq/L 135  Potassium Latest Ref Range: 3.5-5.1 mEq/L 4.6  Chloride Latest Ref Range: 96-112 mEq/L 97  CO2 Latest Ref Range: 19-32 mEq/L 35 (H)  BUN Latest Ref Range: 6-23 mg/dL 14  Creatinine Latest  Ref Range: 0.40-1.50 mg/dL 1.01  Calcium Latest Ref Range: 8.4-10.5 mg/dL 11.7 (H)  Glucose Latest Ref Range: 70-99 mg/dL 77  WBC Latest Ref Range: 4.0-10.5 K/uL 10.0  RBC Latest Ref Range: 4.22-5.81 Mil/uL 5.16  Hemoglobin Latest Ref Range: 13.0-17.0 g/dL 13.1  HCT Latest Ref Range: 39.0-52.0 % 42.3  MCV Latest Ref Range: 78.0-100.0 fl 82.1  MCHC Latest Ref Range: 30.0-36.0 g/dL 31.1  RDW Latest Ref Range: 11.5-15.5 % 24.4 (H)  Platelets Latest Ref Range: 150.0-400.0 K/uL 466.0 (H)    05/25/2016 CXR:  IMPRESSION: Extensive pulmonary metastatic disease as seen on prior CT.  Consolidation in the right upper lobe compatible with pneumonia. Suspect increasing cavitation since prior study.   05/08/2016 CT Angio Chest  IMPRESSION: 1. The study is positive for pulmonary emboli. There is extensive thrombus in the IVC extending inferiorly into the iliac veins. There is a small amount of thrombus in the left renal vein as well as a small amount of thrombus in the splenic and superior mesenteric veins. 2. 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 A mass in the spleen is nonspecific but could represent a metastatic lesion. 3. Right upper lobe pneumonia. 4. 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  CT Abdomen Pelvis 05/12/2016 IMPRESSION: 1. Large left renal mass consistent with renal cell carcinoma. There is bulky associated retroperitoneal metastatic lymphadenopathy within nodules identified in the lower lungs, also consistent with metastatic involvement. Low-density lesion in the medial  spleen is suspicious for splenic metastasis. 2. 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 3. Metastatic destructive lesion at T9 spinous process. No evidence for epidural tumor involvement at this level. 4. Small volume intraperitoneal free fluid with diffuse  body wall edema.  Past medical hx Past Medical History  Diagnosis Date  . Angina at rest [facility])   . Hypertension   . Diabetes mellitus without complication ([place])   . Coronary artery disease   . High cholesterol      Past surgical hx, Family hx, Social hx all reviewed.  Current Outpatient Prescriptions on File Prior to Visit  Medication Sig  . albuterol (PROVENTIL HFA;VENTOLIN HFA) 108 (90 Base) MCG/ACT inhaler Inhale 1-2 puffs into the lungs every 6 (six) hours as needed for wheezing or shortness of breath.  . dapsone 100 MG tablet Take 150 mg by mouth daily.  Marland Kitchen enoxaparin (LOVENOX) 120 MG/0.8ML injection Inject 0.8 mLs (120 mg total) into the skin daily.  Marland Kitchen LORazepam (ATIVAN) 1 MG tablet Take 1 mg by mouth at bedtime.  Marland Kitchen oxyCODONE (ROXICODONE) 15 MG immediate release tablet Take 15 mg by mouth 4 (four) times daily. Reported on 05/25/2016  . umeclidinium-vilanterol (ANORO ELLIPTA) 62.5-25 MCG/INH AEPB Inhale 1 puff into the lungs daily.  . minocycline (MINOCIN,DYNACIN) 100 MG capsule Take 100 mg by mouth daily. Reported on 05/25/2016  . oxyCODONE (OXYCONTIN) 20 mg 12 hr tablet Take 1 tablet (20 mg total) by mouth every 12 (twelve) hours. (Patient not taking: Reported on 05/25/2016)   No current facility-administered medications on file prior to visit.     Allergies  Allergen Reactions  . Doxycycline Rash    Review Of Systems:  Constitutional:   +  weight loss, night sweats,  Fevers, chills, fatigue, or  lassitude.  HEENT:   No headaches,  Difficulty swallowing,  Tooth/dental problems, or  Sore throat,                No sneezing, itching, ear ache, nasal congestion, post nasal drip,   CV:  No chest pain,  Orthopnea, PND, +swelling in lower extremities, no anasarca, dizziness, palpitations, syncope.   GI  No heartburn, indigestion, abdominal pain, nausea, vomiting, diarrhea, change in bowel habits, loss of appetite, bloody stools.   Resp: + shortness of breath with exertion not  at rest.  No excess mucus, + productive cough,  No non-productive cough,  No coughing up of blood.  + change in color of mucus.  No wheezing.  No chest wall deformity  Skin: no rash or lesions.  GU: no dysuria, change in color of urine, no urgency or frequency.  No flank pain, no hematuria   MS:  No joint pain or swelling.  No decreased range of motion.  No back pain.  Psych:  No change in mood or affect. No depression or anxiety.  No memory loss.   Vital Signs BP 100/64 mmHg  Pulse 76  Temp(Src) 97.6 F (36.4 C) (Oral)  Ht 6\' 4"  (1.93 m)  Wt 163 lb (73.936 kg)  BMI 19.85 kg/m2  SpO2 94%   Physical Exam:  General- No distress,  A&Ox3, pleasant, very thin and pale ENT: No sinus tenderness, TM clear, pale nasal mucosa, no oral exudate,no post nasal drip, no LAN Cardiac: S1, S2, regular rate and rhythm, no murmur Chest: No wheeze/ rales/ dullness; no accessory muscle use, no nasal flaring, no sternal retractions Abd.: Soft Non-tender Ext: No clubbing cyanosis,  2+ lower  extremity edema Neuro:  normal strength Skin: No rashes, warm and dry Psych: normal mood and behavior   Assessment/Plan  Pulmonary embolism without acute cor pulmonale (HCC) We will do lab work today. ( CBC, BMET ) We will call you with results Continue you Lovenox daily as you have been doing Continue using your Anoro 1 puff into your lungs once daily. Continue using your Pro Air inhaler as needed for shortness of breath or wheezing. Continue using your nebulizer treatments 4 times daily as Dr. Ilsa Iha prescribed. We will schedule you for a 2D Echo at [facility].( See if can be scheduled tomorrow) Appointment 05/26/2016  At 3 pm with Medical Oncology at [facility]. We will schedule your for a 2 D echo. We will continue Augmentin 875 mg every 12 hours for an additional 2 weeks. Return to the office in 2 weeks for a follow up chest x ray prior to your appointment. Please contact office for sooner  follow up if symptoms do not improve or worsen or seek emergency care       Magdalen Spatz, NP 05/25/2016  5:42 PM

## 2016-05-25 NOTE — Patient Instructions (Addendum)
We will do lab work today. ( CBC, BMET ) We will call you with results Continue you Lovenox daily as you have been doing Continue using your Anoro 1 puff into your lungs once daily. Continue using your Pro Air inhaler as needed for shortness of breath or wheezing. Continue using your nebulizer treatments 4 times daily as Dr. Ilsa Iha prescribed. We will schedule you for a 2D Echo at [facility].( See if can be scheduled tomorrow) Appointment 05/26/2016  At 3 pm with Medical Oncology at [facility]. We will schedule your for a 2 D echo. We will continue Augmentin 875 mg every 12 hours for an additional 2 weeks. Return to the office in 2 weeks for a follow up chest x ray prior to your appointment. Please contact office for sooner follow up if symptoms do not improve or worsen or seek emergency care

## 2016-05-25 NOTE — Assessment & Plan Note (Signed)
We will do lab work today. ( CBC, BMET ) We will call you with results Continue you Lovenox daily as you have been doing Continue using your Anoro 1 puff into your lungs once daily. Continue using your Pro Air inhaler as needed for shortness of breath or wheezing. Continue using your nebulizer treatments 4 times daily as Dr. Ilsa Iha prescribed. We will schedule you for a 2D Echo at [facility].( See if can be scheduled tomorrow) Appointment 05/26/2016  At 3 pm with Medical Oncology at [facility]. We will schedule your for a 2 D echo. We will continue Augmentin 875 mg every 12 hours for an additional 2 weeks. Return to the office in 2 weeks for a follow up chest x ray prior to your appointment. Please contact office for sooner follow up if symptoms do not improve or worsen or seek emergency care

## 2016-05-26 ENCOUNTER — Encounter (HOSPITAL_COMMUNITY): Payer: Medicaid - Out of State | Attending: Hematology & Oncology | Admitting: Hematology & Oncology

## 2016-05-26 ENCOUNTER — Encounter (HOSPITAL_COMMUNITY): Payer: Self-pay | Admitting: Hematology & Oncology

## 2016-05-26 VITALS — BP 117/75 | HR 82 | Temp 98.4°F | Resp 18 | Ht 77.0 in | Wt 167.5 lb

## 2016-05-26 DIAGNOSIS — R93422 Abnormal radiologic findings on diagnostic imaging of left kidney: Secondary | ICD-10-CM

## 2016-05-26 DIAGNOSIS — J189 Pneumonia, unspecified organism: Secondary | ICD-10-CM | POA: Diagnosis not present

## 2016-05-26 DIAGNOSIS — N2889 Other specified disorders of kidney and ureter: Secondary | ICD-10-CM

## 2016-05-26 DIAGNOSIS — G893 Neoplasm related pain (acute) (chronic): Secondary | ICD-10-CM

## 2016-05-26 DIAGNOSIS — Z720 Tobacco use: Secondary | ICD-10-CM | POA: Diagnosis not present

## 2016-05-26 DIAGNOSIS — R918 Other nonspecific abnormal finding of lung field: Secondary | ICD-10-CM

## 2016-05-26 DIAGNOSIS — I2699 Other pulmonary embolism without acute cor pulmonale: Secondary | ICD-10-CM | POA: Diagnosis not present

## 2016-05-26 DIAGNOSIS — C799 Secondary malignant neoplasm of unspecified site: Secondary | ICD-10-CM

## 2016-05-26 MED ORDER — NICOTINE 21 MG/24HR TD PT24
21.0000 mg | MEDICATED_PATCH | TRANSDERMAL | Status: AC
  Administered 2016-05-26: 21 mg via TRANSDERMAL
  Filled 2016-05-26: qty 1

## 2016-05-26 NOTE — Progress Notes (Signed)
[place]  CONSULT NOTE  Patient Care Team: Lucia Gaskins, MD as PCP - General (Internal Medicine)  CHIEF COMPLAINTS/PURPOSE OF CONSULTATION:  Left renal mass    HISTORY OF PRESENTING ILLNESS:  Eric Villarreal 60 y.o. male is here because of a left renal mass discovered during work-up for worsening abdominal pain and low back pain while admitted to the hospital for a large PE. He comes here accompanied by his sister, who enters the room later in his appointment. He is currently hooked up to oxygen with a nasal cannula in place.  He was admitted to the hospital from 05/08/2016- 05/12/2016 for acute pulmonary emboli and hemoptysis.  During work-up, imaging studies were performed demonstrating pulmonary emboli with extensive thrombus in the IVC extending inferiorly into the iliac veins, left renal lesion with findings concerning for metastatic disease to lungs, right hilum, and abdominal nodes (probabily mediastinal nodes too).  I personally reviewed and went over radiographic studies with the patient.  The results are noted within this dictation.    He is educated about his stage of disease based on radiographic findings.  He is aware that he has stage IV disease.  Despite this, he is in good spirits; "I've always been the type to fight whatever's wrong with me, and get well. I don't give up on nothing." He also says "I'll tell you one thing, I feel a lot better than I did when I went into the hospital."   As a result of his VTE, he is on lovenox and his sister, who is a strong pillar of support for Eric Villarreal, is administering his injections.  He says he's lost a lot of weight recently. He says he thinks this began about six months ago. He reports that he used to weigh 190-something, but when he admitted himself to the hospital, he weighed only 151.   Since being anticoagulated, his coughing and breathing has improved.  He admits to LE edema, but that too is improving. In  the recent past, his LE edema prevented him from putting his pants on.  He is now able to get his pants on.  In terms of pain, he describes soreness and weakness in his legs, indicating his thighs. He also has pain in his stomach and his back. He notes that he has vertebral disc issues since his early twenties when he was lifting too much weight.  His back pain flares up every once in a while. These last a few days in the past, however, he currently has back pain that does not resolve.  He says he knows now that it's due to his cancer.  He's been oxygen dependent since being home. He notes that, before his hospital visit, his oxygen level had always been low. He adds that, over time, he could tell his breathing was getting worse.  His PCP, Dr. Cindie Laroche has been managing his COPD with inhalers and nebulizer treatments.   When asked about his appetite, he says "I'm eating good; I'm eating real good." His sister chuckles and says he's eating everything; vegetables and meat. Eric Villarreal says he eats a ton of ice cream every day and that he's eating meals and everything in between. His sister says "I got him a blender and ever since then he's been eating half a gallon of ice cream every other day." He notes that this is a nice improvement because after he got out of the hospital, everything tasted poorly. He also notes that  he's moving his bowels okay. When asked about nausea, he says he got a little nauseated yesterday, but he thinks that came from his pain medicine and cleared up. He denies any blood in his stool, in his urine, and denies coughing up any blood, which is sister confirms.  He's on 15 mg of percocet, and 20 mg of time release 12 hours. Eric Villarreal notes that he's taking the percocet four times a day. He confirms that his pain is a lot better.  They had to borrow money from his landlord yesterday to get his medicine, and had been without it since the 13th. They are trying to work through the  [employer] to get assistance with their medicine.  He's currently back on antibiotics. He was given 28 tablets, twice a day, to clear up the rest of the pneumonia in his lung.  He plans on quitting smoking, and says that he's already cut back a lot. He says he knows that he's needed to quit for a long time now, and that he's tried several times. He notes too that he has an e-cigarette which he uses off and on. He says he used to drink, but that he's never been an abuser. He never drank just to be drunk, never had a DUI, etc. He says "that tells you right there that I wasn't no heavy drinker."  During the physical exam, Eric Villarreal confirms that he's sleeping at night, saying "oh yeah." He also indicates that Dr. Cindie Laroche has given him a little something to help him sleep at night. He denies any constipation on his pain medicine.    MEDICAL HISTORY:  Past Medical History  Diagnosis Date  . Angina at rest [facility])   . Hypertension   . Coronary artery disease   . High cholesterol   . Back pain   . On home O2   . DVT (deep venous thrombosis) ([place])   . Weight loss, unintentional   . COPD (chronic obstructive pulmonary disease) ([place])     SURGICAL HISTORY: Past Surgical History  Procedure Laterality Date  . Neck surgery    . Back surgery      SOCIAL HISTORY: Social History   Social History  . Marital Status: Single    Spouse Name: N/A  . Number of Children: N/A  . Years of Education: N/A   Occupational History  . Not on file.   Social History Main Topics  . Smoking status: Current Every Day Smoker -- 1.00 packs/day  . Smokeless tobacco: Not on file  . Alcohol Use: No  . Drug Use: No  . Sexual Activity: Not on file   Other Topics Concern  . Not on file   Social History Narrative   Born in [place], [place]. He says he's been divorced 14 years. He has 3 sons. He has 5 grandchildren. His sister is going to move in with him to care for him. Only has one sibling, his  sister.  Smokes, but notes that he's cut back a lot. He plans on quitting. He says he used to drink, but that he's never been an abuser. He never drank just to be drunk, never had a DUI, etc. He says "that tells you right there that I wasn't no heavy drinker."  He's been disabled for 10 years. Used to Customer service manager work, Journalist, newspaper; Investment banker, corporate.   FAMILY HISTORY: History reviewed. No pertinent family history.  Both of his parents passed away 20 years ago. He thinks they were  somewhere around 63, 64. His father "drank himself to death." Had 2 or 3 heart surgeries. Mother was a severe diabetic. Got in her eyesight, and she went downhill after his father died.  ALLERGIES:  is allergic to doxycycline.  MEDICATIONS:  Current Outpatient Prescriptions  Medication Sig Dispense Refill  . albuterol (PROVENTIL HFA;VENTOLIN HFA) 108 (90 Base) MCG/ACT inhaler Inhale 1-2 puffs into the lungs every 6 (six) hours as needed for wheezing or shortness of breath.    Marland Kitchen amoxicillin-clavulanate (AUGMENTIN) 875-125 MG tablet Take 1 tablet by mouth 2 (two) times daily. 28 tablet 0  . dapsone 100 MG tablet Take 150 mg by mouth daily.    Marland Kitchen enoxaparin (LOVENOX) 120 MG/0.8ML injection Inject 0.8 mLs (120 mg total) into the skin daily. 30 Syringe 1  . LORazepam (ATIVAN) 1 MG tablet Take 1 mg by mouth at bedtime.    . minocycline (MINOCIN,DYNACIN) 100 MG capsule Take 100 mg by mouth daily. Reported on 05/25/2016    . oxyCODONE (OXYCONTIN) 20 mg 12 hr tablet Take 1 tablet (20 mg total) by mouth every 12 (twelve) hours. 60 tablet 0  . oxyCODONE (ROXICODONE) 15 MG immediate release tablet Take 15 mg by mouth 4 (four) times daily. Reported on 05/25/2016  0  . umeclidinium-vilanterol (ANORO ELLIPTA) 62.5-25 MCG/INH AEPB Inhale 1 puff into the lungs daily.     No current facility-administered medications for this visit.    Review of Systems  Constitutional: Positive for weight loss. Negative for fever and chills.        ~30 pounds over 6 months.  HENT: Negative.   Eyes: Negative.  Negative for blurred vision and double vision.  Respiratory: Negative.  Negative for cough, hemoptysis, sputum production and shortness of breath.        Cough, hemoptysis, sputum, and SOB improved after starting bloodthinners.  Cardiovascular: Positive for leg swelling. Negative for chest pain.       Lower extremity swelling.  Gastrointestinal: Positive for nausea and abdominal pain. Negative for vomiting, diarrhea, constipation, blood in stool and melena.       Nausea a few days ago, due to pain/pain meds. Resolved since. Abdominal pain due to his kidney cancer.  Genitourinary: Negative.   Musculoskeletal: Positive for back pain.       Pain due to his kidney cancer.  Skin: Negative.   Neurological: Negative.  Negative for headaches.  Endo/Heme/Allergies: Negative.   Psychiatric/Behavioral: Negative.   All other systems reviewed and are negative.  14 point ROS was done and is otherwise as detailed above or in HPI  PHYSICAL EXAMINATION: ECOG PERFORMANCE STATUS: 1 - Symptomatic but completely ambulatory  Filed Vitals:   05/26/16 1513  BP: 117/75  Pulse: 82  Temp: 98.4 F (36.9 C)  Resp: 18   Filed Weights   05/26/16 1513  Weight: 167 lb 8 oz (75.978 kg)     Physical Exam  Constitutional: He is oriented to person, place, and time and well-developed, well-nourished, and in no distress.  Thin.  HENT:  Head: Normocephalic and atraumatic.  Nose: Nose normal.  Mouth/Throat: Oropharynx is clear and moist. No oropharyngeal exudate.  Eyes: Conjunctivae and EOM are normal. Pupils are equal, round, and reactive to light. Right eye exhibits no discharge. Left eye exhibits no discharge. No scleral icterus.  Neck: Normal range of motion. Neck supple. No tracheal deviation present. No thyromegaly present.  Cardiovascular: Normal rate, regular rhythm, normal heart sounds and intact distal pulses.  Exam reveals no gallop and  no friction rub.   No murmur heard. Pulmonary/Chest: Effort normal and breath sounds normal. He has no wheezes. He has no rales.  Abdominal: Soft. Bowel sounds are normal. He exhibits no distension and no mass. There is tenderness (mild). There is no rebound and no guarding.  "I'm a little sore."  Musculoskeletal: Normal range of motion. He exhibits no edema.  Lymphadenopathy:    He has no cervical adenopathy.  Neurological: He is alert and oriented to person, place, and time. He has normal reflexes. No cranial nerve deficit. Gait normal. Coordination normal.  Skin: Skin is warm and dry. No rash noted.  Psychiatric: Mood, memory, affect and judgment normal.  Nursing note and vitals reviewed.   LABORATORY DATA:  I have reviewed the data as listed  Results for TYMIER, LINDHOLM (MRN [id number]) as of 05/26/2016 16:38  Ref. Range 05/25/2016 13:56  Sodium Latest Ref Range: 135-145 mEq/L 135  Potassium Latest Ref Range: 3.5-5.1 mEq/L 4.6  Chloride Latest Ref Range: 96-112 mEq/L 97  CO2 Latest Ref Range: 19-32 mEq/L 35 (H)  BUN Latest Ref Range: 6-23 mg/dL 14  Creatinine Latest Ref Range: 0.40-1.50 mg/dL 1.01  Calcium Latest Ref Range: 8.4-10.5 mg/dL 11.7 (H)  Glucose Latest Ref Range: 70-99 mg/dL 77  GFR Latest Ref Range: >60.00 mL/min 80.15  WBC Latest Ref Range: 4.0-10.5 K/uL 10.0  RBC Latest Ref Range: 4.22-5.81 Mil/uL 5.16  Hemoglobin Latest Ref Range: 13.0-17.0 g/dL 13.1  HCT Latest Ref Range: 39.0-52.0 % 42.3  MCV Latest Ref Range: 78.0-100.0 fl 82.1  MCHC Latest Ref Range: 30.0-36.0 g/dL 31.1  RDW Latest Ref Range: 11.5-15.5 % 24.4 (H)  Platelets Latest Ref Range: 150.0-400.0 K/uL 466.0 (H)  Neutrophils Latest Ref Range: 43.0-77.0 % 75.1  Lymphocytes Latest Ref Range: 12.0-46.0 % 14.9  Monocytes Relative Latest Ref Range: 3.0-12.0 % 7.6  Eosinophil Latest Ref Range: 0.0-5.0 % 2.0  Basophil Latest Ref Range: 0.0-3.0 % 0.4  NEUT# Latest Ref Range: 1.4-7.7 K/uL 7.5  Lymphocyte #  Latest Ref Range: 0.7-4.0 K/uL 1.5  Monocyte # Latest Ref Range: 0.1-1.0 K/uL 0.8  Eosinophils Absolute Latest Ref Range: 0.0-0.7 K/uL 0.2  Basophils Absolute Latest Ref Range: 0.0-0.1 K/uL 0.0    RADIOGRAPHIC STUDIES: I have personally reviewed the radiological images as listed and agreed with the findings in the report. Dg Chest 2 View  05/25/2016  CLINICAL DATA:  Hemoptysis.  Smoker. EXAM: CHEST  2 VIEW COMPARISON:  CT chest 05/08/2016 FINDINGS: Again noted are numerous bilateral pulmonary nodules compatible with metastases. Consolidation in the right upper lobe again noted compatible with pneumonia. There is increasing cavitary appearance of the right upper lobe process. No pleural effusions. Heart is normal size. No acute bony abnormality. IMPRESSION: Extensive pulmonary metastatic disease as seen on prior CT. Consolidation in the right upper lobe compatible with pneumonia. Suspect increasing cavitation since prior study. Electronically Signed   By: Rolm Baptise M.D.   On: 05/25/2016 12:09   CLINICAL DATA: Spitting up blood. Blood in stool. Weakness. Angina.  EXAM: CT ANGIOGRAPHY CHEST  CT ABDOMEN AND PELVIS WITH CONTRAST  TECHNIQUE: Multidetector CT imaging of the chest was performed using the standard protocol during bolus administration of intravenous contrast. Multiplanar CT image reconstructions and MIPs were obtained to evaluate the vascular anatomy. Multidetector CT imaging of the abdomen and pelvis was performed using the standard protocol during bolus administration of intravenous contrast.  CONTRAST: 100 mL of Isovue 370  COMPARISON: None  FINDINGS: CTA CHEST FINDINGS  The central airways  are normal. Evaluation of the lungs is somewhat limited due to respiratory motion. There is widespread infiltrate in the right upper lobe consistent with pneumonia. There are innumerable nodules throughout both lungs consistent with metastatic disease. A representative  nodule posteriorly in the right lung base on series 10, image 123 measures 2.7 x 2.1 cm. There is enlarged lymph node in the right hilum on series 8, image 81 measuring 3.9 by 3.4 cm. There is a prominent subcarinal node. Mildly prominent nodes seen in the mediastinum. The thoracic aorta measures 4.2 cm improved. No dissection. No pleural or pericardial effusions. There are coronary artery calcifications. The study is positive for pulmonary emboli most prominent in the right upper and right middle lobes. The embolus burden is relatively mild. No convincing evidence of heart strain.  CT ABDOMEN and PELVIS FINDINGS  No free air. The patient is cachectic and there is evidence of volume load with increased attenuation in the subcutaneous and abdominal fat. There is a large mass in the left kidney measuring 8.8 by 7.8 by 9.9 cm, centered in the lower pole. There is no extension of tumor into the peripheral left renal vein. There is extensive thrombus in the IVC, probably extending inferiorly into the iliac vessels. The thrombus extends to the level of the renal vein drainage. There is at least 1 small filling defect in the central left renal vein but there is no definitive connection to the left renal mass. The defect in the left renal vein is best seen on coronal image 59. The right kidney is normal. The intrahepatic portions of the portal vein are patent. There does appear to be thrombus within the confluence of the splenic and superior mesenteric veins best seen on coronal image 43. The attenuation of the liver is somewhat heterogeneous but no discrete metastases are seen. The gallbladder is unremarkable. There is a mass in the medial spleen on axial image 18. While nonspecific, a metastatic lesion is possible measuring up to 2.6 cm. The right adrenal gland and pancreas are normal. No discrete nodules seen in the left adrenal gland. There are multiple abnormal lymph nodes in the left  side of the retroperitoneum between the kidney in the aorta. A representative node to the left of the aorta on axial image 31 and measures 2.6 by 2.1 cm. The abdominal aorta is normal in caliber. The stomach is poorly evaluated but grossly unremarkable. The small bowel is nonobstructed. The colon is normal. The appendix not visualized but there is no evidence of appendicitis.  The pelvis demonstrates probable extension of thrombus into the iliac vessels. Pelvic loops of bowel are unremarkable. The bladder is thick-walled but otherwise unremarkable. No discrete adenopathy.  No filling defects in the renal collecting systems on delayed images.  No bony metastatic disease.  Review of the MIP images confirms the above findings.  IMPRESSION: 1. The study is positive for pulmonary emboli. There is extensive thrombus in the IVC extending inferiorly into the iliac veins. There is a small amount of thrombus in the left renal vein as well as a small amount of thrombus in the splenic and superior mesenteric veins. 2. 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 A mass in the spleen is nonspecific but could represent a metastatic lesion. 3. Right upper lobe pneumonia. 4. 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 Findings called to Dr. Noemi Chapel   Electronically Signed  By: Dorise Bullion III M.D  On:  05/08/2016 19:35   CLINICAL DATA: Worsening diffuse abdominal and low back pain.  EXAM: CT ABDOMEN AND PELVIS WITH CONTRAST  TECHNIQUE: Multidetector CT imaging of the abdomen and pelvis was performed using the standard protocol following bolus administration of intravenous contrast.  CONTRAST: 174m ISOVUE-300 IOPAMIDOL (ISOVUE-300) INJECTION 61%  COMPARISON: 05/08/2016  FINDINGS: Lower chest: Innumerable pulmonary nodules are seen in the  lung bases.  Hepatobiliary: No focal abnormality within the liver parenchyma. There is no evidence for gallstones, gallbladder wall thickening, or pericholecystic fluid. No intrahepatic or extrahepatic biliary dilation.  Pancreas: No focal mass lesion. No dilatation of the main duct. No intraparenchymal cyst. No peripancreatic edema.  Spleen: 2.8 cm low-density lesion identified in the medial spleen, as before.  Adrenals/Urinary Tract: The adrenal gland is unremarkable. Left adrenal gland not well seen. Tiny hypodensity in the interpolar right kidney is stable. 8.4 x 9.5 x 9.3 cm heterogeneous mass again noted in the lower pole of the left kidney. No evidence for hydroureteronephrosis. The urinary bladder appears normal for the degree of distention.  Stomach/Bowel: Stomach is nondistended. No gastric wall thickening. No evidence of outlet obstruction. Duodenum is normally positioned as is the ligament of Treitz. No small bowel wall thickening. No small bowel dilatation. No gross colonic mass. No colonic wall thickening. No substantial diverticular change.  Vascular/Lymphatic: There is abdominal aortic atherosclerosis without aneurysm. Bulky retroperitoneal lymphadenopathy again noted. 2.1 x 2.6 cm left para-aortic lymph node measured previously is now 2.3 x 3.1 cm. As noted on the prior study, there is occlusive thrombus in the IVC, extending from the level of the renal veins caudally into the common iliac and external iliac veins bilaterally.  Reproductive: The prostate gland and seminal vesicles have normal imaging features.  Other: Small amount of intraperitoneal free fluid is identified. There is diffuse body wall edema throughout.  Musculoskeletal: Soft tissue lesion obliterates the T9 spinous process (see image 3 series 2).  IMPRESSION: 1. Large left renal mass consistent with renal cell carcinoma. There is bulky associated retroperitoneal metastatic  lymphadenopathy within nodules identified in the lower lungs, also consistent with metastatic involvement. Low-density lesion in the medial spleen is suspicious for splenic metastasis. 2. 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 3. Metastatic destructive lesion at T9 spinous process. No evidence for epidural tumor involvement at this level. 4. Small volume intraperitoneal free fluid with diffuse body wall edema.   Electronically Signed  By: EMisty StanleyM.D.  On: 05/12/2016 13:51   ASSESSMENT & PLAN:  Left renal mass, 8.8 x 7.8 x 9.9 cm Bulky retroperitoneal metastatic lymphadenopathy Nodules in both lungs Destructive lesion at T9 spinous process- no epidural involvement Pulmonary Emboli Thrombus in IVC extending inferiorly into the iliac veins, left renal vein, splenic vein, and SMV. RUL pneumonia Tobacco Use  Radiographic findings are suspicious for metastatic renal cell carcinoma.  The patient is advised of his radiographics findings and concerns for metastatic renal cell carcinoma.  If this is the case, he understands that he has Stage IV disease.  The only way to definitively confirm this diagnosis is via a biopsy.  He is given some reading information regarding renal cell carcinoma.  We will ask radiology to review his scans and see what area would be mos appropriate for biopsy.  I suspect bone lesion would be most amenable for biopsy, and this would confirm metastatic disease, in addition he could be off of anticoagulation for the shortest duration. He has been on anticoagulation  for appropriate duration and we can proceed with biopsy.  Once diagnosis is confirmed, we will start therapy with a TKI (if renal cell carcinoma is biopsy proven); Sutent. Patient is educated that with treatment, he will likely feel better.  During the appointment, we spent a significant amount of time discussing the nature of chemotherapy on this  disease. With treatment, and as his malignancy responds, his clot burden will improve and resolve, his energy will improve, he will gain weight, and feel better again.  Smoking cessation education is provided today. I will provide him materials for smoking cessation today. We will continue to help him with this.  We will encourage participation in smoking classes.  He is advised that we will manage his pain medications.  We may need to get some of his pain medication(s) approved through his medical insurance. He will take his prescriptions to [place], and then we will work on authorization for it.  He was provided a 3 month supply of his Lovenox previously. We will regroup post biopsy. He has met with our patient navigator, and has multiple points of contact here at the cancer center.   ORDERS PLACED FOR THIS ENCOUNTER: No orders of the defined types were placed in this encounter.    MEDICATIONS PRESCRIBED THIS ENCOUNTER: No orders of the defined types were placed in this encounter.   All questions were answered. The patient knows to call the clinic with any problems, questions or concerns.  This document serves as a record of services personally performed by Ancil Linsey, MD. It was created on her behalf by Toni Amend, a trained medical scribe. The creation of this record is based on the scribe's personal observations and the provider's statements to them. This document has been checked and approved by the attending provider.  I have reviewed the above documentation for accuracy and completeness and I agree with the above.  This note was electronically signed.    Molli Hazard, MD   05/26/2016 4:45 PM

## 2016-05-26 NOTE — Patient Instructions (Signed)
[place] at [facility] Discharge Instructions  RECOMMENDATIONS MADE BY THE CONSULTANT AND ANY TEST RESULTS WILL BE SENT TO YOUR REFERRING PHYSICIAN.  We are setting you up for a biopsy in [place].   We will call you when we know the appointment date of the biopsy. We will then reschedule you to be seen by Dr. Whitney Muse.     Thank you for choosing [place] at [facility] to provide your oncology and hematology care.  To afford each patient quality time with our provider, please arrive at least 15 minutes before your scheduled appointment time.   Beginning January 23rd 2017 lab work for the [facility] will be done in the  Main lab at [facility] on 1st floor. If you have a lab appointment with the [facility] please come in thru the  Main Entrance and check in at the main information desk  You need to re-schedule your appointment should you arrive 10 or more minutes late.  We strive to give you quality time with our providers, and arriving late affects you and other patients whose appointments are after yours.  Also, if you no show three or more times for appointments you may be dismissed from the clinic at the providers discretion.     Again, thank you for choosing [facility].  Our hope is that these requests will decrease the amount of time that you wait before being seen by our physicians.       _____________________________________________________________  Should you have questions after your visit to [facility], please contact our office at [phone] between the hours of 8:30 a.m. and 4:30 p.m.  Voicemails left after 4:30 p.m. will not be returned until the following business day.  For prescription refill requests, have your pharmacy contact our office.         Resources For Cancer Patients and their Caregivers ? American Cancer Society: Can assist with transportation, wigs, general needs, runs  Look Good Feel Better.        (281)887-7905 ? Cancer Care: Provides financial assistance, online support groups, medication/co-pay assistance.  1-800-813-HOPE 682-051-4096) ? [place] Assists [place] cancer patients and their families through emotional , educational and financial support.  717-226-9126 ? [place] DSS Where to apply for food stamps, Medicaid and utility assistance. (571)474-6492 ? [employer]: Transportation to medical appointments. 630-240-5975 ? Social Security Administration: May apply for disability if have a Stage IV cancer. (518)283-0085 (678) 153-7923 ? [employer], Disability and Transit Services: Assists with nutrition, care and transit needs. [place] Support Programs: @10RELATIVEDAYS @ > Cancer Support Group  2nd Tuesday of the month 1pm-2pm, Journey Room  > Creative Journey  3rd Tuesday of the month 1130am-1pm, Journey Room  > Look Good Feel Better  1st Wednesday of the month 10am-12 noon, Journey Room (Call [place] to register [phone])

## 2016-05-27 ENCOUNTER — Other Ambulatory Visit (HOSPITAL_COMMUNITY): Payer: Self-pay | Admitting: Oncology

## 2016-05-27 DIAGNOSIS — C799 Secondary malignant neoplasm of unspecified site: Secondary | ICD-10-CM

## 2016-05-27 NOTE — Progress Notes (Signed)
Quick Note:  LVM for pt to return call ______ 

## 2016-05-28 ENCOUNTER — Ambulatory Visit (HOSPITAL_COMMUNITY): Payer: Medicaid - Out of State

## 2016-05-28 ENCOUNTER — Telehealth (HOSPITAL_COMMUNITY): Payer: Self-pay | Admitting: *Deleted

## 2016-05-28 NOTE — Telephone Encounter (Signed)
Pt already knew

## 2016-05-28 NOTE — Telephone Encounter (Signed)
-----   Message from Baird Cancer, PA-C sent at 05/27/2016  6:03 PM EDT ----- Regarding: RE: Bx request Yes.    Nursing: Please have patient hold 1 dose of Lovenox prior to procedure.  TK ----- Message -----    From: Roosvelt Maser    Sent: 05/27/2016   2:53 PM      To: Baird Cancer, PA-C Subject: RE: Bx request                                 Renee Harder,  Will He be able to hold 1 dose of his lovenox for this biopsy?   ----- Message -----    From: Baird Cancer, PA-C    Sent: 05/27/2016   2:46 PM      To: Jacqulynn Cadet, MD, Roosvelt Maser Subject: RE: Bx request                                 Thank you.  Order is placed  TK ----- Message -----    From: Jacqulynn Cadet, MD    Sent: 05/26/2016   5:43 PM      To: Barnabas Lister, PA-C Subject: Bx request                                     Tom,  We can do it.    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  HKM   ----- Message -----    From: Baird Cancer, PA-C    Sent: 05/26/2016   4:02 PM      To: Jacqulynn Cadet, MD  Good afternoon,  Do you think that you can get a biopsy on this man?  He had a huge PE and is on anticoagulation.  If you think you can get a diagnosis, then we will alter his anticoagulation accordingly.  Thank you  TK

## 2016-05-29 NOTE — Progress Notes (Signed)
Reviewed and agree with assessment/plan.  Chesley Mires, MD [facility] Pulmonary/Critical Care 05/29/2016, 8:58 PM Pager:  [phone]

## 2016-06-01 NOTE — Progress Notes (Signed)
Reviewed, I agree with this plan of care 

## 2016-06-01 NOTE — Progress Notes (Signed)
Quick Note:  LVM for pt to return call ______ 

## 2016-06-04 ENCOUNTER — Telehealth (HOSPITAL_COMMUNITY): Payer: Self-pay | Admitting: *Deleted

## 2016-06-05 ENCOUNTER — Other Ambulatory Visit: Payer: Self-pay | Admitting: Radiology

## 2016-06-05 ENCOUNTER — Other Ambulatory Visit: Payer: Self-pay | Admitting: General Surgery

## 2016-06-05 ENCOUNTER — Telehealth (HOSPITAL_COMMUNITY): Payer: Self-pay | Admitting: Emergency Medicine

## 2016-06-05 NOTE — Telephone Encounter (Signed)
Spoke with pt, needs a refill on his pain medication.  He is having increased pain.  Spoke with Dr Whitney Muse, refill pain medication, pt can pick up prescription tomorrow.

## 2016-06-06 ENCOUNTER — Other Ambulatory Visit (HOSPITAL_COMMUNITY): Payer: Self-pay | Admitting: *Deleted

## 2016-06-06 DIAGNOSIS — R52 Pain, unspecified: Secondary | ICD-10-CM | POA: Insufficient documentation

## 2016-06-06 DIAGNOSIS — C799 Secondary malignant neoplasm of unspecified site: Secondary | ICD-10-CM

## 2016-06-06 MED ORDER — OXYCODONE HCL ER 60 MG PO T12A: 60.0000 mg | EXTENDED_RELEASE_TABLET | Freq: Two times a day (BID) | ORAL | Status: DC

## 2016-06-06 MED ORDER — NALOXONE HCL 4 MG/0.1ML NA LIQD: NASAL | Status: AC

## 2016-06-06 MED ORDER — SUNITINIB MALATE 50 MG PO CAPS: ORAL_CAPSULE | ORAL | Status: DC

## 2016-06-06 MED ORDER — OXYCODONE HCL 15 MG PO TABS: 15.0000 mg | ORAL_TABLET | Freq: Four times a day (QID) | ORAL | Status: DC

## 2016-06-08 ENCOUNTER — Ambulatory Visit (HOSPITAL_COMMUNITY): Payer: Medicaid - Out of State

## 2016-06-08 ENCOUNTER — Encounter (HOSPITAL_COMMUNITY): Payer: Self-pay

## 2016-06-08 ENCOUNTER — Ambulatory Visit (HOSPITAL_COMMUNITY)
Admission: RE | Admit: 2016-06-08 | Discharge: 2016-06-08 | Disposition: A | Discharge status: Home / Self Care | Payer: Medicaid - Out of State | Source: Ambulatory Visit | Attending: Oncology | Admitting: Oncology

## 2016-06-08 DIAGNOSIS — N2889 Other specified disorders of kidney and ureter: Secondary | ICD-10-CM | POA: Diagnosis present

## 2016-06-08 DIAGNOSIS — R918 Other nonspecific abnormal finding of lung field: Secondary | ICD-10-CM | POA: Diagnosis not present

## 2016-06-08 DIAGNOSIS — A419 Sepsis, unspecified organism: Secondary | ICD-10-CM | POA: Insufficient documentation

## 2016-06-08 DIAGNOSIS — D649 Anemia, unspecified: Secondary | ICD-10-CM | POA: Diagnosis not present

## 2016-06-08 DIAGNOSIS — C649 Malignant neoplasm of unspecified kidney, except renal pelvis: Secondary | ICD-10-CM | POA: Diagnosis not present

## 2016-06-08 DIAGNOSIS — R52 Pain, unspecified: Secondary | ICD-10-CM | POA: Insufficient documentation

## 2016-06-08 DIAGNOSIS — J9601 Acute respiratory failure with hypoxia: Secondary | ICD-10-CM | POA: Diagnosis not present

## 2016-06-08 DIAGNOSIS — Z86718 Personal history of other venous thrombosis and embolism: Secondary | ICD-10-CM | POA: Insufficient documentation

## 2016-06-08 DIAGNOSIS — I10 Essential (primary) hypertension: Secondary | ICD-10-CM | POA: Insufficient documentation

## 2016-06-08 DIAGNOSIS — E7800 Pure hypercholesterolemia, unspecified: Secondary | ICD-10-CM | POA: Diagnosis not present

## 2016-06-08 DIAGNOSIS — I2510 Atherosclerotic heart disease of native coronary artery without angina pectoris: Secondary | ICD-10-CM | POA: Insufficient documentation

## 2016-06-08 DIAGNOSIS — M899 Disorder of bone, unspecified: Secondary | ICD-10-CM | POA: Diagnosis not present

## 2016-06-08 DIAGNOSIS — C799 Secondary malignant neoplasm of unspecified site: Secondary | ICD-10-CM

## 2016-06-08 LAB — PROTIME-INR
INR: 1.15 (ref 0.00–1.49)
PROTHROMBIN TIME: 14.9 s (ref 11.6–15.2)

## 2016-06-08 LAB — CBC
HCT: 41.4 % (ref 39.0–52.0)
HEMOGLOBIN: 13.6 g/dL (ref 13.0–17.0)
MCH: 27.1 pg (ref 26.0–34.0)
MCHC: 32.9 g/dL (ref 30.0–36.0)
MCV: 82.5 fL (ref 78.0–100.0)
PLATELETS: 291 10*3/uL (ref 150–400)
RBC: 5.02 MIL/uL (ref 4.22–5.81)
RDW: 21.6 % — ABNORMAL HIGH (ref 11.5–15.5)
WBC: 13.9 10*3/uL — AB (ref 4.0–10.5)

## 2016-06-08 LAB — APTT: APTT: 27 s (ref 24–37)

## 2016-06-08 MED ORDER — FENTANYL CITRATE (PF) 100 MCG/2ML IJ SOLN
INTRAMUSCULAR | Status: AC
  Filled 2016-06-08: qty 4

## 2016-06-08 MED ORDER — MIDAZOLAM HCL 2 MG/2ML IJ SOLN
INTRAMUSCULAR | Status: AC | PRN
  Administered 2016-06-08 (×2): 0.5 mg via INTRAVENOUS

## 2016-06-08 MED ORDER — MIDAZOLAM HCL 2 MG/2ML IJ SOLN
INTRAMUSCULAR | Status: AC
  Filled 2016-06-08: qty 6

## 2016-06-08 MED ORDER — FENTANYL CITRATE (PF) 100 MCG/2ML IJ SOLN
INTRAMUSCULAR | Status: AC | PRN
  Administered 2016-06-08: 25 ug via INTRAVENOUS

## 2016-06-08 MED ORDER — SODIUM CHLORIDE 0.9 % IV SOLN: INTRAVENOUS | Status: DC

## 2016-06-08 NOTE — Discharge Instructions (Signed)
Needle Biopsy, Care After °These instructions give you information about caring for yourself after your procedure. Your doctor may also give you more specific instructions. Call your doctor if you have any problems or questions after your procedure. °HOME CARE °· Rest as told by your doctor. °· Take medicines only as told by your doctor. °· There are many different ways to close and cover the biopsy site, including stitches (sutures), skin glue, and adhesive strips. Follow instructions from your doctor about: °¨ How to take care of your biopsy site. °¨ When and how you should change your bandage (dressing). °¨ When you should remove your dressing. °¨ Removing whatever was used to close your biopsy site. °· Check your biopsy site every day for signs of infection. Watch for: °¨ Redness, swelling, or pain. °¨ Fluid, blood, or pus. °GET HELP IF: °· You have a fever. °· You have redness, swelling, or pain at the biopsy site, and it lasts longer than a few days. °· You have fluid, blood, or pus coming from the biopsy site. °· You feel sick to your stomach (nauseous). °· You throw up (vomit). °GET HELP RIGHT AWAY IF: °· You are short of breath. °· You have trouble breathing. °· Your chest hurts. °· You feel dizzy or you pass out (faint). °· You have bleeding that does not stop with pressure or a bandage. °· You cough up blood. °· Your belly (abdomen) hurts. °  °This information is not intended to replace advice given to you by your health care provider. Make sure you discuss any questions you have with your health care provider. °  °Document Released: 10/29/2008 Document Revised: 04/02/2015 Document Reviewed: 11/12/2014 °Elsevier Interactive Patient Education ©2016 Elsevier Inc. °Moderate Conscious Sedation, Adult, Care After °Refer to this sheet in the next few weeks. These instructions provide you with information on caring for yourself after your procedure. Your health care provider may also give you more specific  instructions. Your treatment has been planned according to current medical practices, but problems sometimes occur. Call your health care provider if you have any problems or questions after your procedure. °WHAT TO EXPECT AFTER THE PROCEDURE  °After your procedure: °· You may feel sleepy, clumsy, and have poor balance for several hours. °· Vomiting may occur if you eat too soon after the procedure. °HOME CARE INSTRUCTIONS °· Do not participate in any activities where you could become injured for at least 24 hours. Do not: °· Drive. °· Swim. °· Ride a bicycle. °· Operate heavy machinery. °· Cook. °· Use power tools. °· Climb ladders. °· Work from a high place. °· Do not make important decisions or sign legal documents until you are improved. °· If you vomit, drink water, juice, or soup when you can drink without vomiting. Make sure you have little or no nausea before eating solid foods. °· Only take over-the-counter or prescription medicines for pain, discomfort, or fever as directed by your health care provider. °· Make sure you and your family fully understand everything about the medicines given to you, including what side effects may occur. °· You should not drink alcohol, take sleeping pills, or take medicines that cause drowsiness for at least 24 hours. °· If you smoke, do not smoke without supervision. °· If you are feeling better, you may resume normal activities 24 hours after you were sedated. °· Keep all appointments with your health care provider. °SEEK MEDICAL CARE IF: °· Your skin is pale or bluish in color. °· You   continue to feel nauseous or vomit. °· Your pain is getting worse and is not helped by medicine. °· You have bleeding or swelling. °· You are still sleepy or feeling clumsy after 24 hours. °SEEK IMMEDIATE MEDICAL CARE IF: °· You develop a rash. °· You have difficulty breathing. °· You develop any type of allergic problem. °· You have a fever. °MAKE SURE YOU: °· Understand these  instructions. °· Will watch your condition. °· Will get help right away if you are not doing well or get worse. °  °This information is not intended to replace advice given to you by your health care provider. Make sure you discuss any questions you have with your health care provider. °  °Document Released: 09/06/2013 Document Revised: 12/07/2014 Document Reviewed: 09/06/2013 °Elsevier Interactive Patient Education ©2016 Elsevier Inc. ° °

## 2016-06-08 NOTE — Consult Note (Signed)
Chief Complaint: Patient was seen in consultation today for CT-guided T9 paraspinous soft tissue mass biopsy  Referring Physician(s): Baird Cancer  Supervising Physician: Arne Cleveland  Patient Status: Outpatient  History of Present Illness: Eric Villarreal is a 60 y.o. male with significant past medical history including COPD, coronary artery disease, hypertension, diabetes, hyperlipidemia, prior hemoptysis, recent pulmonary emboli with IVC thrombus, and left renal mass. Further imaging has also revealed lung nodules as well as metastatic destructive lesion of the T9 spinous process. He presents today for CT-guided T9 paraspinous soft tissue mass biopsy for further evaluation.  Past Medical History  Diagnosis Date  . Angina at rest [facility])   . Hypertension   . Coronary artery disease   . High cholesterol   . Back pain   . On home O2   . DVT (deep venous thrombosis) ([place])   . Weight loss, unintentional   . COPD (chronic obstructive pulmonary disease) ([place])     Past Surgical History  Procedure Laterality Date  . Neck surgery    . Back surgery      Allergies: Doxycycline  Medications: Prior to Admission medications   Medication Sig Start Date End Date Taking? Authorizing Provider  albuterol (PROVENTIL HFA;VENTOLIN HFA) 108 (90 Base) MCG/ACT inhaler Inhale 1-2 puffs into the lungs every 6 (six) hours as needed for wheezing or shortness of breath.   Yes Historical Provider, MD  amoxicillin-clavulanate (AUGMENTIN) 875-125 MG tablet Take 1 tablet by mouth 2 (two) times daily. 05/25/16  Yes Magdalen Spatz, NP  dapsone 100 MG tablet Take 150 mg by mouth daily.   Yes Historical Provider, MD  LORazepam (ATIVAN) 1 MG tablet Take 1 mg by mouth at bedtime.   Yes Historical Provider, MD  minocycline (MINOCIN,DYNACIN) 100 MG capsule Take 100 mg by mouth daily. Reported on 05/25/2016   Yes Historical Provider, MD  oxyCODONE (ROXICODONE) 15 MG immediate release tablet Take 1  tablet (15 mg total) by mouth 4 (four) times daily. Reported on 05/25/2016 06/06/16  Yes Patrici Ranks, MD  umeclidinium-vilanterol (ANORO ELLIPTA) 62.5-25 MCG/INH AEPB Inhale 1 puff into the lungs daily.   Yes Historical Provider, MD  enoxaparin (LOVENOX) 120 MG/0.8ML injection Inject 0.8 mLs (120 mg total) into the skin daily. 05/12/16   Orson Eva, MD  naloxone HCl 4 MG/0.1ML LIQD Only use for opioid overdose. Place in nostril, spray 1 time, and breathe in. If this does not reverse the opioid overdose, 1 additional spray every 2 to 3 minutes until EMS arrives can be administered. 06/06/16   Patrici Ranks, MD  oxyCODONE (OXYCONTIN) 60 MG 12 hr tablet Take 60 mg by mouth every 12 (twelve) hours. 06/06/16   Patrici Ranks, MD  SUNItinib (SUTENT) 50 MG capsule Take 2 weeks on and one week off 06/06/16   Patrici Ranks, MD     History reviewed. No pertinent family history.  Social History   Social History  . Marital Status: Single    Spouse Name: N/A  . Number of Children: N/A  . Years of Education: N/A   Social History Main Topics  . Smoking status: Current Every Day Smoker -- 1.00 packs/day  . Smokeless tobacco: None  . Alcohol Use: No  . Drug Use: No  . Sexual Activity: Not Asked   Other Topics Concern  . None   Social History Narrative      Review of Systems currently denies fever, headache; has had intermittent chest discomfort, some dyspnea  with exertion, occasional cough, abdominal/back pain, nausea/ vomiting and weight loss.  Vital Signs: BP 91/61 mmHg  Pulse 75  Temp(Src) 98.7 F (37.1 C) (Oral)  Resp 18  Physical Exam cachectic-appearing white male in no acute distress. Chest with distant breath sounds and few basilar crackles; heart with regular rate and rhythm; soft, positive bowel sounds, tender, palpable left lateral abdominal mass; right lower extremity edema noted, new left lower extremity edema  Mallampati Score:     Imaging: Dg Chest 2  View  05/25/2016  CLINICAL DATA:  Hemoptysis.  Smoker. EXAM: CHEST  2 VIEW COMPARISON:  CT chest 05/08/2016 FINDINGS: Again noted are numerous bilateral pulmonary nodules compatible with metastases. Consolidation in the right upper lobe again noted compatible with pneumonia. There is increasing cavitary appearance of the right upper lobe process. No pleural effusions. Heart is normal size. No acute bony abnormality. IMPRESSION: Extensive pulmonary metastatic disease as seen on prior CT. Consolidation in the right upper lobe compatible with pneumonia. Suspect increasing cavitation since prior study. Electronically Signed   By: Rolm Baptise M.D.   On: 05/25/2016 12:09   Ct Abdomen Pelvis W Contrast  05/12/2016  CLINICAL DATA:  Worsening diffuse abdominal and low back pain. EXAM: CT ABDOMEN AND PELVIS WITH CONTRAST TECHNIQUE: Multidetector CT imaging of the abdomen and pelvis was performed using the standard protocol following bolus administration of intravenous contrast. CONTRAST:  131mL ISOVUE-300 IOPAMIDOL (ISOVUE-300) INJECTION 61% COMPARISON:  05/08/2016 FINDINGS: Lower chest: Innumerable pulmonary nodules are seen in the lung bases. Hepatobiliary: No focal abnormality within the liver parenchyma. There is no evidence for gallstones, gallbladder wall thickening, or pericholecystic fluid. No intrahepatic or extrahepatic biliary dilation. Pancreas: No focal mass lesion. No dilatation of the main duct. No intraparenchymal cyst. No peripancreatic edema. Spleen: 2.8 cm low-density lesion identified in the medial spleen, as before. Adrenals/Urinary Tract: The adrenal gland is unremarkable. Left adrenal gland not well seen. Tiny hypodensity in the interpolar right kidney is stable. 8.4 x 9.5 x 9.3 cm heterogeneous mass again noted in the lower pole of the left kidney. No evidence for hydroureteronephrosis. The urinary bladder appears normal for the degree of distention. Stomach/Bowel: Stomach is nondistended. No  gastric wall thickening. No evidence of outlet obstruction. Duodenum is normally positioned as is the ligament of Treitz. No small bowel wall thickening. No small bowel dilatation. No gross colonic mass. No colonic wall thickening. No substantial diverticular change. Vascular/Lymphatic: There is abdominal aortic atherosclerosis without aneurysm. Bulky retroperitoneal lymphadenopathy again noted. 2.1 x 2.6 cm left para-aortic lymph node measured previously is now 2.3 x 3.1 cm. As noted on the prior study, there is occlusive thrombus in the IVC, extending from the level of the renal veins caudally into the common iliac and external iliac veins bilaterally. Reproductive: The prostate gland and seminal vesicles have normal imaging features. Other: Small amount of intraperitoneal free fluid is identified. There is diffuse body wall edema throughout. Musculoskeletal: Soft tissue lesion obliterates the T9 spinous process (see image 3 series 2). IMPRESSION: 1. Large left renal mass consistent with renal cell carcinoma. There is bulky associated retroperitoneal metastatic lymphadenopathy within nodules identified in the lower lungs, also consistent with metastatic involvement. Low-density lesion in the medial spleen is suspicious for splenic metastasis. 2. 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 3. Metastatic destructive lesion at T9 spinous process. No evidence for epidural tumor involvement at this level. 4. Small volume intraperitoneal free fluid with diffuse body wall edema. Electronically  Signed   By: Misty Stanley M.D.   On: 05/12/2016 13:51   Dg Chest Port 1 View  05/10/2016  CLINICAL DATA:  Acute respiratory failure, hypoxemia EXAM: PORTABLE CHEST 1 VIEW COMPARISON:  05/08/2016 FINDINGS: Cardiomediastinal silhouette is stable. Right IJ central line is stable in position. Persistent patchy infiltrate/ pneumonia right upper lobe. Bilateral pulmonary nodules are again  noted. New streaky bilateral basilar atelectasis or infiltrate. IMPRESSION: Persistent bilateral pulmonary nodules consistent with metastatic disease. Persistent patchy infiltrate/pneumonia in right upper lobe. New streaky bilateral basilar atelectasis or infiltrate. Right IJ central line is unchanged in position. Electronically Signed   By: Lahoma Crocker M.D.   On: 05/10/2016 09:22    Labs:  CBC:  Recent Labs  05/11/16 0545 05/12/16 0500 05/25/16 1356 06/08/16 1006  WBC 12.8* 12.8* 10.0 13.9*  HGB 11.5* 10.7* 13.1 13.6  HCT 38.8* 37.3* 42.3 41.4  PLT 470* 564* 466.0* 291    COAGS:  Recent Labs  05/08/16 1641 05/09/16 0140  INR 1.12 1.17    BMP:  Recent Labs  05/08/16 1641 05/09/16 0140 05/10/16 0310 05/11/16 0545 05/25/16 1356  NA 130* 134* 134* 134* 135  K 4.3 4.2 4.3 4.4 4.6  CL 93* 100* 100* 100* 97  CO2 28 28 28 27  35*  GLUCOSE 101* 89 123* 87 77  BUN 28* 18 13 9 14   CALCIUM 10.2 9.2 9.2 9.2 11.7*  CREATININE 1.11 0.85 0.82 0.68 1.01  GFRNONAA >60 >60 >60 >60  --   GFRAA >60 >60 >60 >60  --     LIVER FUNCTION TESTS:  Recent Labs  05/08/16 1641 05/09/16 0140  BILITOT 1.0 1.0  AST 17 14*  ALT 12* 11*  ALKPHOS 179* 145*  PROT 7.3 5.3*  ALBUMIN 2.5* 1.8*    TUMOR MARKERS: No results for input(s): AFPTM, CEA, CA199, CHROMGRNA in the last 8760 hours.  Assessment and Plan: 60 y.o. male with significant past medical history including COPD, coronary artery disease, hypertension, diabetes, hyperlipidemia, prior hemoptysis, recent pulmonary emboli with IVC thrombus, and left renal mass. Further imaging has also revealed lung nodules as well as metastatic destructive lesion of the T9 spinous process. He presents today for CT-guided T9 paraspinous soft tissue mass biopsy for further evaluation.Risks and benefits discussed with the patient /sister including, but not limited to bleeding, infection, damage to adjacent structures or low yield requiring additional  tests.All of the patient's questions were answered, patient is agreeable to proceed.Consent signed and in chart.     Thank you for this interesting consult.  I greatly enjoyed meeting Eric Villarreal and look forward to participating in their care.  A copy of this report was sent to the requesting provider on this date.  Electronically Signed: D. Rowe Robert 06/08/2016, 10:47 AM   I spent a total of  25 minutes   in face to face in clinical consultation, greater than 50% of which was counseling/coordinating care for CT-guided T9 paraspinous soft tissue mass biopsy

## 2016-06-08 NOTE — Procedures (Signed)
CT core bx thoracic spine mass 18g core x3 to surg path No complication No blood loss. See complete dictation in [facility].

## 2016-06-09 ENCOUNTER — Ambulatory Visit (HOSPITAL_COMMUNITY)
Admission: RE | Admit: 2016-06-09 | Discharge: 2016-06-09 | Disposition: A | Discharge status: Home / Self Care | Payer: Medicaid - Out of State | Source: Ambulatory Visit | Attending: Acute Care | Admitting: Acute Care

## 2016-06-09 DIAGNOSIS — I77810 Thoracic aortic ectasia: Secondary | ICD-10-CM | POA: Diagnosis not present

## 2016-06-09 DIAGNOSIS — R0600 Dyspnea, unspecified: Secondary | ICD-10-CM

## 2016-06-09 DIAGNOSIS — I340 Nonrheumatic mitral (valve) insufficiency: Secondary | ICD-10-CM | POA: Insufficient documentation

## 2016-06-09 DIAGNOSIS — I351 Nonrheumatic aortic (valve) insufficiency: Secondary | ICD-10-CM | POA: Insufficient documentation

## 2016-06-09 NOTE — Progress Notes (Signed)
*  PRELIMINARY RESULTS* Echocardiogram 2D Echocardiogram has been performed.  Eric Villarreal 06/09/2016, 10:35 AM

## 2016-06-10 ENCOUNTER — Other Ambulatory Visit (HOSPITAL_COMMUNITY): Payer: Self-pay | Admitting: Oncology

## 2016-06-10 ENCOUNTER — Encounter (HOSPITAL_COMMUNITY): Payer: Self-pay | Admitting: Oncology

## 2016-06-10 DIAGNOSIS — C649 Malignant neoplasm of unspecified kidney, except renal pelvis: Secondary | ICD-10-CM

## 2016-06-10 MED ORDER — SUNITINIB MALATE 50 MG PO CAPS: 50.0000 mg | ORAL_CAPSULE | Freq: Every day | ORAL | Status: DC

## 2016-06-10 NOTE — Progress Notes (Signed)
Quick Note:  Letter sent to pt. Nothing further needed. ______

## 2016-06-11 ENCOUNTER — Ambulatory Visit (HOSPITAL_COMMUNITY): Payer: Medicaid - Out of State

## 2016-06-14 ENCOUNTER — Encounter (HOSPITAL_COMMUNITY): Payer: Self-pay | Admitting: Hematology & Oncology

## 2016-06-15 ENCOUNTER — Ambulatory Visit: Payer: Medicaid - Out of State | Admitting: Acute Care

## 2016-06-16 ENCOUNTER — Telehealth: Payer: Self-pay | Admitting: Acute Care

## 2016-06-16 NOTE — Telephone Encounter (Signed)
Notes Recorded by Magdalen Spatz, NP on 05/25/2016 at 5:40 PM Please call Mr. Loga and let him know his lab work was normal. Thanks so much.  Called spoke with Dorian Pod. She reports pt is currently hospitalized but will relay info to him. Nothing further needed

## 2016-06-17 ENCOUNTER — Telehealth (HOSPITAL_COMMUNITY): Payer: Self-pay

## 2016-06-17 ENCOUNTER — Ambulatory Visit (HOSPITAL_COMMUNITY): Payer: Medicaid - Out of State | Admitting: Hematology & Oncology

## 2016-06-17 NOTE — Progress Notes (Signed)
This encounter was created in error - please disregard.

## 2016-06-22 ENCOUNTER — Telehealth (HOSPITAL_COMMUNITY): Payer: Self-pay | Admitting: *Deleted

## 2016-06-23 ENCOUNTER — Other Ambulatory Visit (HOSPITAL_COMMUNITY): Payer: Self-pay | Admitting: Hematology & Oncology

## 2016-06-23 ENCOUNTER — Other Ambulatory Visit (HOSPITAL_COMMUNITY): Payer: Self-pay | Admitting: Emergency Medicine

## 2016-06-23 DIAGNOSIS — C649 Malignant neoplasm of unspecified kidney, except renal pelvis: Secondary | ICD-10-CM

## 2016-06-23 NOTE — Progress Notes (Signed)
Called pt to set up follow up appt.  Scheduled for 8/4 with labs.  Pt has not received Sutent yet.  Let pt know that we are working on the prescription and it is coming from the specialty pharmacy.  Pt verbalized understanding

## 2016-06-24 ENCOUNTER — Telehealth (HOSPITAL_COMMUNITY): Payer: Self-pay | Admitting: Hematology & Oncology

## 2016-06-24 ENCOUNTER — Other Ambulatory Visit (HOSPITAL_COMMUNITY): Payer: Self-pay | Admitting: Emergency Medicine

## 2016-06-24 NOTE — Telephone Encounter (Signed)
FAXED PT ASSIST APP TO [place]  [phone]

## 2016-06-24 NOTE — Progress Notes (Signed)
Called and left pt a message that [facility] has been trying to reach him in regards to his Sutent.  He needs to call them back.  Please call us back at [phone]

## 2016-06-25 ENCOUNTER — Telehealth (HOSPITAL_COMMUNITY): Payer: Self-pay | Admitting: *Deleted

## 2016-06-25 ENCOUNTER — Other Ambulatory Visit (HOSPITAL_COMMUNITY): Payer: Self-pay | Admitting: Emergency Medicine

## 2016-06-25 ENCOUNTER — Telehealth (HOSPITAL_COMMUNITY): Payer: Self-pay | Admitting: Hematology & Oncology

## 2016-06-25 DIAGNOSIS — N2889 Other specified disorders of kidney and ureter: Secondary | ICD-10-CM

## 2016-06-25 DIAGNOSIS — C801 Malignant (primary) neoplasm, unspecified: Secondary | ICD-10-CM

## 2016-06-25 DIAGNOSIS — G893 Neoplasm related pain (acute) (chronic): Secondary | ICD-10-CM

## 2016-06-25 MED ORDER — MISC. DEVICES MISC: 0 refills | Status: DC

## 2016-06-25 MED ORDER — OXYCODONE HCL 15 MG PO TABS: 15.0000 mg | ORAL_TABLET | Freq: Four times a day (QID) | ORAL | 0 refills | Status: DC

## 2016-06-25 MED ORDER — MISC. DEVICES MISC: 0 refills | Status: AC

## 2016-06-25 NOTE — Progress Notes (Unsigned)
Talked to pts sister, she is going to call [facility] and give them an address to have the medication delivered to.  Also wanted a refill on oxycodone 15 mg and I refilled that.

## 2016-06-25 NOTE — Telephone Encounter (Signed)
[facility]

## 2016-06-25 NOTE — Telephone Encounter (Signed)
Prescription for shower chair and commode extender faxed to [facility] per patient request.

## 2016-06-26 ENCOUNTER — Other Ambulatory Visit (HOSPITAL_COMMUNITY): Payer: Self-pay | Admitting: Oncology

## 2016-06-26 ENCOUNTER — Other Ambulatory Visit (HOSPITAL_COMMUNITY): Payer: Self-pay | Admitting: Emergency Medicine

## 2016-06-26 DIAGNOSIS — C649 Malignant neoplasm of unspecified kidney, except renal pelvis: Secondary | ICD-10-CM

## 2016-06-26 MED ORDER — MORPHINE SULFATE ER 30 MG PO TBCR: 30.0000 mg | EXTENDED_RELEASE_TABLET | Freq: Two times a day (BID) | ORAL | 0 refills | Status: DC

## 2016-06-26 MED ORDER — MORPHINE SULFATE ER 60 MG PO TBCR: 60.0000 mg | EXTENDED_RELEASE_TABLET | Freq: Two times a day (BID) | ORAL | 0 refills | Status: DC

## 2016-06-26 NOTE — Progress Notes (Signed)
Notified pt that chair was faxed to [facility] and morphine os ready to be picked up

## 2016-06-29 ENCOUNTER — Encounter (HOSPITAL_COMMUNITY): Payer: Self-pay | Admitting: Emergency Medicine

## 2016-06-29 NOTE — Progress Notes (Unsigned)
Called Eric Villarreal to see if pt had received his Sutent.  Pt has been taking the medication for about 4 days now.  He will have a follow up appt on Friday.  She got his shower chair and things today.

## 2016-06-30 ENCOUNTER — Telehealth (HOSPITAL_COMMUNITY): Payer: Self-pay | Admitting: Hematology & Oncology

## 2016-06-30 NOTE — Telephone Encounter (Signed)
PER CSR AT [facility] 60MG  TABS HAVE BEEN APPROVED BUT THE 30 MG TABS STILL REQ AUTH. [place] PHONE. AUTH PENDING

## 2016-07-03 ENCOUNTER — Encounter (HOSPITAL_COMMUNITY): Payer: Self-pay | Admitting: Emergency Medicine

## 2016-07-03 ENCOUNTER — Encounter (HOSPITAL_COMMUNITY): Payer: Medicaid - Out of State | Attending: Hematology & Oncology

## 2016-07-03 ENCOUNTER — Ambulatory Visit (HOSPITAL_COMMUNITY): Payer: Medicaid - Out of State | Admitting: Hematology & Oncology

## 2016-07-03 DIAGNOSIS — E875 Hyperkalemia: Secondary | ICD-10-CM | POA: Diagnosis present

## 2016-07-03 DIAGNOSIS — C649 Malignant neoplasm of unspecified kidney, except renal pelvis: Secondary | ICD-10-CM | POA: Insufficient documentation

## 2016-07-03 LAB — CBC WITH DIFFERENTIAL/PLATELET
BASOS PCT: 0 %
Basophils Absolute: 0 10*3/uL (ref 0.0–0.1)
Eosinophils Absolute: 0.1 10*3/uL (ref 0.0–0.7)
Eosinophils Relative: 2 %
HEMATOCRIT: 44 % (ref 39.0–52.0)
Hemoglobin: 13.2 g/dL (ref 13.0–17.0)
LYMPHS PCT: 12 %
Lymphs Abs: 0.9 10*3/uL (ref 0.7–4.0)
MCH: 26.4 pg (ref 26.0–34.0)
MCHC: 30 g/dL (ref 30.0–36.0)
MCV: 88 fL (ref 78.0–100.0)
MONO ABS: 0.4 10*3/uL (ref 0.1–1.0)
MONOS PCT: 5 %
NEUTROS ABS: 6 10*3/uL (ref 1.7–7.7)
Neutrophils Relative %: 81 %
Platelets: 251 10*3/uL (ref 150–400)
RBC: 5 MIL/uL (ref 4.22–5.81)
RDW: 21.3 % — AB (ref 11.5–15.5)
WBC: 7.5 10*3/uL (ref 4.0–10.5)

## 2016-07-03 LAB — COMPREHENSIVE METABOLIC PANEL
ALT: 7 U/L — ABNORMAL LOW (ref 17–63)
ANION GAP: 3 — AB (ref 5–15)
AST: 14 U/L — ABNORMAL LOW (ref 15–41)
Albumin: 2.6 g/dL — ABNORMAL LOW (ref 3.5–5.0)
Alkaline Phosphatase: 105 U/L (ref 38–126)
BILIRUBIN TOTAL: 0.3 mg/dL (ref 0.3–1.2)
BUN: 16 mg/dL (ref 6–20)
CO2: 31 mmol/L (ref 22–32)
Calcium: 8.2 mg/dL — ABNORMAL LOW (ref 8.9–10.3)
Chloride: 99 mmol/L — ABNORMAL LOW (ref 101–111)
Creatinine, Ser: 1 mg/dL (ref 0.61–1.24)
Glucose, Bld: 85 mg/dL (ref 65–99)
POTASSIUM: 5.4 mmol/L — AB (ref 3.5–5.1)
Sodium: 133 mmol/L — ABNORMAL LOW (ref 135–145)
TOTAL PROTEIN: 7 g/dL (ref 6.5–8.1)

## 2016-07-03 NOTE — Progress Notes (Signed)
Bedside commode faxed to [facility]

## 2016-07-06 ENCOUNTER — Other Ambulatory Visit (HOSPITAL_COMMUNITY): Payer: Self-pay | Admitting: Oncology

## 2016-07-06 DIAGNOSIS — E875 Hyperkalemia: Secondary | ICD-10-CM

## 2016-07-06 MED ORDER — SODIUM POLYSTYRENE SULFONATE PO POWD: Freq: Once | ORAL | 0 refills | Status: AC

## 2016-07-10 ENCOUNTER — Encounter (HOSPITAL_COMMUNITY): Payer: Medicaid - Out of State | Attending: Adult Health | Admitting: Adult Health

## 2016-07-10 ENCOUNTER — Encounter (HOSPITAL_COMMUNITY): Payer: Medicaid - Out of State

## 2016-07-10 ENCOUNTER — Encounter (HOSPITAL_COMMUNITY): Payer: Self-pay | Admitting: Adult Health

## 2016-07-10 ENCOUNTER — Other Ambulatory Visit (HOSPITAL_COMMUNITY): Payer: Self-pay | Admitting: Oncology

## 2016-07-10 VITALS — BP 100/75 | HR 92 | Temp 97.5°F | Resp 16 | Wt 148.0 lb

## 2016-07-10 DIAGNOSIS — K219 Gastro-esophageal reflux disease without esophagitis: Secondary | ICD-10-CM

## 2016-07-10 DIAGNOSIS — M899 Disorder of bone, unspecified: Secondary | ICD-10-CM

## 2016-07-10 DIAGNOSIS — C801 Malignant (primary) neoplasm, unspecified: Secondary | ICD-10-CM

## 2016-07-10 DIAGNOSIS — N289 Disorder of kidney and ureter, unspecified: Secondary | ICD-10-CM

## 2016-07-10 DIAGNOSIS — C772 Secondary and unspecified malignant neoplasm of intra-abdominal lymph nodes: Secondary | ICD-10-CM | POA: Diagnosis not present

## 2016-07-10 DIAGNOSIS — Z720 Tobacco use: Secondary | ICD-10-CM

## 2016-07-10 DIAGNOSIS — K5900 Constipation, unspecified: Secondary | ICD-10-CM

## 2016-07-10 DIAGNOSIS — R918 Other nonspecific abnormal finding of lung field: Secondary | ICD-10-CM | POA: Diagnosis not present

## 2016-07-10 DIAGNOSIS — C649 Malignant neoplasm of unspecified kidney, except renal pelvis: Secondary | ICD-10-CM

## 2016-07-10 DIAGNOSIS — I81 Portal vein thrombosis: Secondary | ICD-10-CM

## 2016-07-10 DIAGNOSIS — I823 Embolism and thrombosis of renal vein: Secondary | ICD-10-CM

## 2016-07-10 DIAGNOSIS — E875 Hyperkalemia: Secondary | ICD-10-CM

## 2016-07-10 DIAGNOSIS — T402X5A Adverse effect of other opioids, initial encounter: Principal | ICD-10-CM

## 2016-07-10 DIAGNOSIS — K5903 Drug induced constipation: Secondary | ICD-10-CM

## 2016-07-10 DIAGNOSIS — D735 Infarction of spleen: Secondary | ICD-10-CM

## 2016-07-10 DIAGNOSIS — J189 Pneumonia, unspecified organism: Secondary | ICD-10-CM

## 2016-07-10 DIAGNOSIS — I82429 Acute embolism and thrombosis of unspecified iliac vein: Secondary | ICD-10-CM

## 2016-07-10 LAB — POTASSIUM: POTASSIUM: 4.6 mmol/L (ref 3.5–5.1)

## 2016-07-10 MED ORDER — SUNITINIB MALATE 50 MG PO CAPS: 50.0000 mg | ORAL_CAPSULE | Freq: Every day | ORAL | 6 refills | Status: DC

## 2016-07-10 MED ORDER — POLYETHYLENE GLYCOL 3350 17 G PO PACK
17.0000 g | PACK | Freq: Every day | ORAL | 6 refills | Status: AC
Start: 2016-07-10 — End: ?

## 2016-07-10 MED ORDER — SUNITINIB MALATE 50 MG PO CAPS: 50.0000 mg | ORAL_CAPSULE | Freq: Every day | ORAL | 6 refills | Status: AC

## 2016-07-10 MED ORDER — PANTOPRAZOLE SODIUM 40 MG PO TBEC: 40.0000 mg | DELAYED_RELEASE_TABLET | Freq: Every day | ORAL | 5 refills | Status: AC

## 2016-07-10 MED ORDER — SENNOSIDES-DOCUSATE SODIUM 8.6-50 MG PO TABS: 1.0000 | ORAL_TABLET | Freq: Two times a day (BID) | ORAL | 5 refills | Status: AC

## 2016-07-10 NOTE — Patient Instructions (Signed)
[place] at [facility] Discharge Instructions  RECOMMENDATIONS MADE BY THE CONSULTANT AND ANY TEST RESULTS WILL BE SENT TO YOUR REFERRING PHYSICIAN.  You saw Eric Craze, NP today. Follow up with labs in one month.  Thank you for choosing [place] at [facility] to provide your oncology and hematology care.  To afford each patient quality time with our provider, please arrive at least 15 minutes before your scheduled appointment time.   Beginning January 23rd 2017 lab work for the [facility] will be done in the  Main lab at [facility] on 1st floor. If you have a lab appointment with the [facility] please come in thru the  Main Entrance and check in at the main information desk  You need to re-schedule your appointment should you arrive 10 or more minutes late.  We strive to give you quality time with our providers, and arriving late affects you and other patients whose appointments are after yours.  Also, if you no show three or more times for appointments you may be dismissed from the clinic at the providers discretion.     Again, thank you for choosing [facility].  Our hope is that these requests will decrease the amount of time that you wait before being seen by our physicians.       _____________________________________________________________  Should you have questions after your visit to [facility], please contact our office at [phone] between the hours of 8:30 a.m. and 4:30 p.m.  Voicemails left after 4:30 p.m. will not be returned until the following business day.  For prescription refill requests, have your pharmacy contact our office.         Resources For Cancer Patients and their Caregivers ? American [employer] Society: Can assist with transportation, wigs, general needs, runs Look Good Feel Better.        325-236-7151 ? Cancer Care: Provides financial assistance, online support  groups, medication/co-pay assistance.  1-800-813-HOPE 938-101-6156) ? [place] Assists [place] cancer patients and their families through emotional , educational and financial support.  3676142897 ? [place] DSS Where to apply for food stamps, Medicaid and utility assistance. 612-223-5639 ? [facility]: Transportation to medical appointments. 8140400696 ? Social Security Administration: May apply for disability if have a Stage IV cancer. (713)023-9122 901-451-1259 ? [employer], Disability and Transit Services: Assists with nutrition, care and transit needs. [place] Support Programs: @10RELATIVEDAYS @ > Cancer Support Group  2nd Tuesday of the month 1pm-2pm, Journey Room  > Creative Journey  3rd Tuesday of the month 1130am-1pm, Journey Room  > Look Good Feel Better  1st Wednesday of the month 10am-12 noon, Journey Room (Call [place] to register [phone])

## 2016-07-10 NOTE — Progress Notes (Signed)
[facility] NOTE  Patient Care Team: Lucia Gaskins, MD as PCP - General (Internal Medicine)  CHIEF COMPLAINTS/PURPOSE OF CONSULTATION:  Left renal mass    HISTORY OF PRESENTING ILLNESS:  (from previous visit 06/17/16) Eric Villarreal 60 y.o. male is here because of a left renal mass discovered during work-up for worsening abdominal pain and low back pain while admitted to the hospital for a large PE. He comes here accompanied by his sister, who enters the room later in his appointment. He is currently hooked up to oxygen with a nasal cannula in place.  He was admitted to the hospital from 05/08/2016- 05/12/2016 for acute pulmonary emboli and hemoptysis.  During work-up, imaging studies were performed demonstrating pulmonary emboli with extensive thrombus in the IVC extending inferiorly into the iliac veins, left renal lesion with findings concerning for metastatic disease to lungs, right hilum, and abdominal nodes (probabily mediastinal nodes too).  I personally reviewed and went over radiographic studies with the patient.  The results are noted within this dictation.    He is educated about his stage of disease based on radiographic findings.  He is aware that he has stage IV disease.  Despite this, he is in good spirits; "I've always been the type to fight whatever's wrong with me, and get well. I don't give up on nothing." He also says "I'll tell you one thing, I feel a lot better than I did when I went into the hospital."   As a result of his VTE, he is on lovenox and his sister, who is a strong pillar of support for Mr. Teuscher, is administering his injections.  He says he's lost a lot of weight recently. He says he thinks this began about six months ago. He reports that he used to weigh 190-something, but when he admitted himself to the hospital, he weighed only 151.   Since being anticoagulated, his coughing and breathing has improved.  He admits to LE edema,  but that too is improving. In the recent past, his LE edema prevented him from putting his pants on.  He is now able to get his pants on.  In terms of pain, he describes soreness and weakness in his legs, indicating his thighs. He also has pain in his stomach and his back. He notes that he has vertebral disc issues since his early twenties when he was lifting too much weight.  His back pain flares up every once in a while. These last a few days in the past, however, he currently has back pain that does not resolve.  He says he knows now that it's due to his cancer.  He's been oxygen dependent since being home. He notes that, before his hospital visit, his oxygen level had always been low. He adds that, over time, he could tell his breathing was getting worse.  His PCP, Dr. Cindie Laroche has been managing his COPD with inhalers and nebulizer treatments.   When asked about his appetite, he says "I'm eating good; I'm eating real good." His sister chuckles and says he's eating everything; vegetables and meat. Mr. Mcknight says he eats a ton of ice cream every day and that he's eating meals and everything in between. His sister says "I got him a blender and ever since then he's been eating half a gallon of ice cream every other day." He notes that this is a nice improvement because after he got out of the hospital, everything tasted poorly.  He also notes that he's moving his bowels okay. When asked about nausea, he says he got a little nauseated yesterday, but he thinks that came from his pain medicine and cleared up. He denies any blood in his stool, in his urine, and denies coughing up any blood, which is sister confirms.  He's on 15 mg of percocet, and 20 mg of time release 12 hours. Mr. Loflin notes that he's taking the percocet four times a day. He confirms that his pain is a lot better.  They had to borrow money from his landlord yesterday to get his medicine, and had been without it since the 13th. They are  trying to work through the [facility] to get assistance with their medicine.  He's currently back on antibiotics. He was given 28 tablets, twice a day, to clear up the rest of the pneumonia in his lung.  He plans on quitting smoking, and says that he's already cut back a lot. He says he knows that he's needed to quit for a long time now, and that he's tried several times. He notes too that he has an e-cigarette which he uses off and on. He says he used to drink, but that he's never been an abuser. He never drank just to be drunk, never had a DUI, etc. He says "that tells you right there that I wasn't no heavy drinker."  During the physical exam, Mr. Gaspar Cola confirms that he's sleeping at night, saying "oh yeah." He also indicates that Dr. Cindie Laroche has given him a little something to help him sleep at night. He denies any constipation on his pain medicine.    INTERVAL HISTORY (07/10/16):   Mr. Bowns returns to the [facility] today accompanied by his sister.  He remains on the Sutent and has completed 14 days of treatment thus far.    He notes that he's having acid reflux, with "liquid coming up in my mouth." He says "it's about to run me crazy."  He notes that it started about a week ago, approximately 07/01/16.    When asked about constipation, he doesn't feel like his stool softeners are strong enough, noting "I was bound up at one time." When asked about Miralax, he says "it just ain't quite enough." He notes that he was taking it every day, "until I got myself cleaned out." After he got "cleaned out," he stopped taking it.   He says he's having a bowel movement every other day, but sometimes it's every day. He confirms that he's still taking the 90 mg of the long-acting MS Contin pain medicine. He also takes the oxycodone whenever he needs it, saying he usually takes 2-3 per day, but can take up to 4 per day.   His sister says he's been going to the bathroom every day, but he took the  last softener he had this morning.  Since getting out the hospital, he notes that his cough and SOB have cleared up. He has not noticed any rash or mouth sores. He confirms that his appetite is pretty good, and his sister comments that "you should see what he eats for breakfast when he isn't sick!"  He denies any pain and feels that his pain is well-controlled on his current pain medication regimen.   He recently had hyperkalemia, with K 5.4.  His sister confirms that she has removed the potassium supplements from his medications, which has reflected in an improvement in his labs today.  His potassium is 4.6  today.   During the physical exam, he notes that his main concern is his acid reflux.   MEDICAL HISTORY:  Past Medical History:  Diagnosis Date  . Angina at rest [facility])   . Back pain   . COPD (chronic obstructive pulmonary disease) ([place])   . Coronary artery disease   . DVT (deep venous thrombosis) ([place])   . High cholesterol   . Hypertension   . Metastatic renal cell carcinoma ([place])   . On home O2   . Weight loss, unintentional     SURGICAL HISTORY: Past Surgical History:  Procedure Laterality Date  . BACK SURGERY    . NECK SURGERY      SOCIAL HISTORY: Social History   Social History  . Marital status: Single    Spouse name: N/A  . Number of children: N/A  . Years of education: N/A   Occupational History  . Not on file.   Social History Main Topics  . Smoking status: Current Every Day Smoker    Packs/day: 1.00  . Smokeless tobacco: Never Used  . Alcohol use No  . Drug use: No  . Sexual activity: Not on file   Other Topics Concern  . Not on file   Social History Narrative  . No narrative on file   Born in [place], [place]. He says he's been divorced 14 years. He has 3 sons. He has 5 grandchildren. His sister is going to move in with him to care for him. Only has one sibling, his sister.  Smokes, but notes that he's cut back a lot. He plans on quitting. He  says he used to drink, but that he's never been an abuser. He never drank just to be drunk, never had a DUI, etc. He says "that tells you right there that I wasn't no heavy drinker."  He's been disabled for 10 years. Used to Customer service manager work, Journalist, newspaper; Investment banker, corporate.   FAMILY HISTORY: History reviewed. No pertinent family history.  Both of his parents passed away 20 years ago. He thinks they were somewhere around 60, 64. His father "drank himself to death." Had 2 or 3 heart surgeries. Mother was a severe diabetic. Got in her eyesight, and she went downhill after his father died.  ALLERGIES:  is allergic to doxycycline.  MEDICATIONS:  Current Outpatient Prescriptions  Medication Sig Dispense Refill  . albuterol (PROVENTIL HFA;VENTOLIN HFA) 108 (90 Base) MCG/ACT inhaler Inhale 1-2 puffs into the lungs every 6 (six) hours as needed for wheezing or shortness of breath.    Marland Kitchen amoxicillin-clavulanate (AUGMENTIN) 875-125 MG tablet Take 1 tablet by mouth 2 (two) times daily. 28 tablet 0  . dapsone 100 MG tablet Take 150 mg by mouth daily.    Marland Kitchen enoxaparin (LOVENOX) 120 MG/0.8ML injection Inject 0.8 mLs (120 mg total) into the skin daily. 30 Syringe 1  . LORazepam (ATIVAN) 1 MG tablet Take 1 mg by mouth at bedtime.    . minocycline (MINOCIN,DYNACIN) 100 MG capsule Take 100 mg by mouth daily. Reported on 05/25/2016    . Misc. Devices MISC Please provide the patient with a shower chair and a commode extender due to metastatic cancer. 1 each 0  . morphine (MS CONTIN) 30 MG 12 hr tablet Take 1 tablet (30 mg total) by mouth every 12 (twelve) hours. 60 tablet 0  . morphine (MS CONTIN) 60 MG 12 hr tablet Take 1 tablet (60 mg total) by mouth every 12 (twelve) hours. 60 tablet 0  . naloxone  HCl 4 MG/0.1ML LIQD Only use for opioid overdose. Place in nostril, spray 1 time, and breathe in. If this does not reverse the opioid overdose, 1 additional spray every 2 to 3 minutes until EMS arrives can be  administered. 1 each 1  . oxyCODONE (ROXICODONE) 15 MG immediate release tablet Take 1 tablet (15 mg total) by mouth 4 (four) times daily. Reported on 05/25/2016 90 tablet 0  . umeclidinium-vilanterol (ANORO ELLIPTA) 62.5-25 MCG/INH AEPB Inhale 1 puff into the lungs daily.    . pantoprazole (PROTONIX) 40 MG tablet Take 1 tablet (40 mg total) by mouth daily. 30 tablet 5  . polyethylene glycol (MIRALAX) packet Take 17 g by mouth daily. 14 each 6  . senna-docusate (SENNA S) 8.6-50 MG tablet Take 1 tablet by mouth 2 (two) times daily. 60 tablet 5  . SUNItinib (SUTENT) 50 MG capsule Take 1 capsule (50 mg total) by mouth daily. Take 2 weeks on and one week off 14 capsule 6   No current facility-administered medications for this visit.     Review of Systems  Constitutional: Positive for weight loss. Negative for chills and fever.       ~30 pounds over 6 months.  HENT: Negative.   Eyes: Negative.  Negative for blurred vision and double vision.  Respiratory: Negative.  Negative for cough, hemoptysis, sputum production and shortness of breath.        Cough, hemoptysis, sputum, and SOB improved after starting bloodthinners.  Cardiovascular: Positive for leg swelling. Negative for chest pain.       Lower extremity swelling.  Gastrointestinal: Positive for abdominal pain, constipation and heartburn. Negative for blood in stool, diarrhea, melena, nausea and vomiting.       Abdominal pain due to his kidney cancer.  Genitourinary: Negative.   Musculoskeletal: Positive for back pain.       Pain due to his kidney cancer.  Skin: Negative.   Neurological: Negative.  Negative for headaches.  Endo/Heme/Allergies: Negative.   Psychiatric/Behavioral: Negative.   All other systems reviewed and are negative.  14 point ROS was done and is otherwise as detailed above or in HPI    PHYSICAL EXAMINATION: ECOG PERFORMANCE STATUS: 1 - Symptomatic but completely ambulatory  Vitals:   07/10/16 1231  BP: 100/75    Pulse: 92  Resp: 16  Temp: 97.5 F (36.4 C)   Filed Weights   07/10/16 1231  Weight: 148 lb (67.1 kg)     Physical Exam  Constitutional: He is oriented to person, place, and time and well-developed, well-nourished, and in no distress.  Thin.  HENT:  Head: Normocephalic and atraumatic.  Nose: Nose normal.  Mouth/Throat: Oropharynx is clear and moist. No oropharyngeal exudate.  Eyes: Conjunctivae and EOM are normal. Pupils are equal, round, and reactive to light. Right eye exhibits no discharge. Left eye exhibits no discharge. No scleral icterus.  Neck: Normal range of motion. Neck supple. No tracheal deviation present. No thyromegaly present.  Cardiovascular: Normal rate, regular rhythm, normal heart sounds and intact distal pulses.  Exam reveals no gallop and no friction rub.   No murmur heard. Pulmonary/Chest: Effort normal and breath sounds normal. He has no wheezes. He has no rales.  Abdominal: Soft. Bowel sounds are normal. He exhibits no distension and no mass. There is tenderness (mild) in the epigastric area. There is no rebound and no guarding.  "I'm a little sore."  Musculoskeletal: Normal range of motion. He exhibits no edema.  Lymphadenopathy:    He has  no cervical adenopathy.       Right: No supraclavicular adenopathy present.       Left: No supraclavicular adenopathy present.  Neurological: He is alert and oriented to person, place, and time. Gait normal. Coordination normal.  No focal deficits  Skin: Skin is warm and dry. No rash noted.  Psychiatric: Mood, memory, affect and judgment normal.  Nursing note and vitals reviewed.   LABORATORY DATA:  I have reviewed the data as listed  Results for CASH, DUCE (MRN [id number]) as of 07/03/2016  Ref. Range 07/03/2016 11:15  Sodium Latest Ref Range: 135 - 145 mmol/L 133 (L)  Potassium Latest Ref Range: 3.5 - 5.1 mmol/L 5.4 (H)  Chloride Latest Ref Range: 101 - 111 mmol/L 99 (L)  CO2 Latest Ref Range: 22 - 32  mmol/L 31  BUN Latest Ref Range: 6 - 20 mg/dL 16  Creatinine Latest Ref Range: 0.61 - 1.24 mg/dL 1.00  Calcium Latest Ref Range: 8.9 - 10.3 mg/dL 8.2 (L)  EGFR (Non-African Amer.) Latest Ref Range: >60 mL/min >60  EGFR (African American) Latest Ref Range: >60 mL/min >60  Glucose Latest Ref Range: 65 - 99 mg/dL 85  Anion gap Latest Ref Range: 5 - 15  3 (L)  Alkaline Phosphatase Latest Ref Range: 38 - 126 U/L 105  Albumin Latest Ref Range: 3.5 - 5.0 g/dL 2.6 (L)  AST Latest Ref Range: 15 - 41 U/L 14 (L)  ALT Latest Ref Range: 17 - 63 U/L 7 (L)  Total Protein Latest Ref Range: 6.5 - 8.1 g/dL 7.0  Total Bilirubin Latest Ref Range: 0.3 - 1.2 mg/dL 0.3  WBC Latest Ref Range: 4.0 - 10.5 K/uL 7.5  RBC Latest Ref Range: 4.22 - 5.81 MIL/uL 5.00  Hemoglobin Latest Ref Range: 13.0 - 17.0 g/dL 13.2  HCT Latest Ref Range: 39.0 - 52.0 % 44.0  MCV Latest Ref Range: 78.0 - 100.0 fL 88.0  MCH Latest Ref Range: 26.0 - 34.0 pg 26.4  MCHC Latest Ref Range: 30.0 - 36.0 g/dL 30.0  RDW Latest Ref Range: 11.5 - 15.5 % 21.3 (H)  Platelets Latest Ref Range: 150 - 400 K/uL 251  Neutrophils Latest Units: % 81  Lymphocytes Latest Units: % 12  Monocytes Relative Latest Units: % 5  Eosinophil Latest Units: % 2  Basophil Latest Units: % 0  NEUT# Latest Ref Range: 1.7 - 7.7 K/uL 6.0  Lymphocyte # Latest Ref Range: 0.7 - 4.0 K/uL 0.9  Monocyte # Latest Ref Range: 0.1 - 1.0 K/uL 0.4  Eosinophils Absolute Latest Ref Range: 0.0 - 0.7 K/uL 0.1  Basophils Absolute Latest Ref Range: 0.0 - 0.1 K/uL 0.0   Results for GERRITT, GALENTINE (MRN [id number]) as of 07/10/2016 15:44  Ref. Range 07/10/2016 11:19  Potassium Latest Ref Range: 3.5 - 5.1 mmol/L 4.6    RADIOGRAPHIC STUDIES: I have personally reviewed the radiological images as listed and agreed with the findings in the report. No results found. CLINICAL DATA: Spitting up blood. Blood in stool. Weakness. Angina.  EXAM: CT ANGIOGRAPHY CHEST  CT ABDOMEN AND  PELVIS WITH CONTRAST  TECHNIQUE: Multidetector CT imaging of the chest was performed using the standard protocol during bolus administration of intravenous contrast. Multiplanar CT image reconstructions and MIPs were obtained to evaluate the vascular anatomy. Multidetector CT imaging of the abdomen and pelvis was performed using the standard protocol during bolus administration of intravenous contrast.  CONTRAST: 100 mL of Isovue 370  COMPARISON: None  FINDINGS: CTA CHEST FINDINGS  The central airways are normal. Evaluation of the lungs is somewhat limited due to respiratory motion. There is widespread infiltrate in the right upper lobe consistent with pneumonia. There are innumerable nodules throughout both lungs consistent with metastatic disease. A representative nodule posteriorly in the right lung base on series 10, image 123 measures 2.7 x 2.1 cm. There is enlarged lymph node in the right hilum on series 8, image 81 measuring 3.9 by 3.4 cm. There is a prominent subcarinal node. Mildly prominent nodes seen in the mediastinum. The thoracic aorta measures 4.2 cm improved. No dissection. No pleural or pericardial effusions. There are coronary artery calcifications. The study is positive for pulmonary emboli most prominent in the right upper and right middle lobes. The embolus burden is relatively mild. No convincing evidence of heart strain.  CT ABDOMEN and PELVIS FINDINGS  No free air. The patient is cachectic and there is evidence of volume load with increased attenuation in the subcutaneous and abdominal fat. There is a large mass in the left kidney measuring 8.8 by 7.8 by 9.9 cm, centered in the lower pole. There is no extension of tumor into the peripheral left renal vein. There is extensive thrombus in the IVC, probably extending inferiorly into the iliac vessels. The thrombus extends to the level of the renal vein drainage. There is at least 1 small filling  defect in the central left renal vein but there is no definitive connection to the left renal mass. The defect in the left renal vein is best seen on coronal image 59. The right kidney is normal. The intrahepatic portions of the portal vein are patent. There does appear to be thrombus within the confluence of the splenic and superior mesenteric veins best seen on coronal image 43. The attenuation of the liver is somewhat heterogeneous but no discrete metastases are seen. The gallbladder is unremarkable. There is a mass in the medial spleen on axial image 18. While nonspecific, a metastatic lesion is possible measuring up to 2.6 cm. The right adrenal gland and pancreas are normal. No discrete nodules seen in the left adrenal gland. There are multiple abnormal lymph nodes in the left side of the retroperitoneum between the kidney in the aorta. A representative node to the left of the aorta on axial image 31 and measures 2.6 by 2.1 cm. The abdominal aorta is normal in caliber. The stomach is poorly evaluated but grossly unremarkable. The small bowel is nonobstructed. The colon is normal. The appendix not visualized but there is no evidence of appendicitis.  The pelvis demonstrates probable extension of thrombus into the iliac vessels. Pelvic loops of bowel are unremarkable. The bladder is thick-walled but otherwise unremarkable. No discrete adenopathy.  No filling defects in the renal collecting systems on delayed images.  No bony metastatic disease.  Review of the MIP images confirms the above findings.  IMPRESSION: 1. The study is positive for pulmonary emboli. There is extensive thrombus in the IVC extending inferiorly into the iliac veins. There is a small amount of thrombus in the left renal vein as well as a small amount of thrombus in the splenic and superior mesenteric veins. 2. Left renal cell carcinoma with metastatic disease to the lungs, right hilum, abdominal  nodes, and probably mediastinal nodes. A mass in the spleen is nonspecific but could represent a metastatic lesion. 3. Right upper lobe pneumonia. 4. 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 Findings called to Dr. Noemi Chapel   Electronically Signed  By: Dorise Bullion III  M.D  On: 05/08/2016 19:35   CLINICAL DATA: Worsening diffuse abdominal and low back pain.  EXAM: CT ABDOMEN AND PELVIS WITH CONTRAST  TECHNIQUE: Multidetector CT imaging of the abdomen and pelvis was performed using the standard protocol following bolus administration of intravenous contrast.  CONTRAST: 161m ISOVUE-300 IOPAMIDOL (ISOVUE-300) INJECTION 61%  COMPARISON: 05/08/2016  FINDINGS: Lower chest: Innumerable pulmonary nodules are seen in the lung bases.  Hepatobiliary: No focal abnormality within the liver parenchyma. There is no evidence for gallstones, gallbladder wall thickening, or pericholecystic fluid. No intrahepatic or extrahepatic biliary dilation.  Pancreas: No focal mass lesion. No dilatation of the main duct. No intraparenchymal cyst. No peripancreatic edema.  Spleen: 2.8 cm low-density lesion identified in the medial spleen, as before.  Adrenals/Urinary Tract: The adrenal gland is unremarkable. Left adrenal gland not well seen. Tiny hypodensity in the interpolar right kidney is stable. 8.4 x 9.5 x 9.3 cm heterogeneous mass again noted in the lower pole of the left kidney. No evidence for hydroureteronephrosis. The urinary bladder appears normal for the degree of distention.  Stomach/Bowel: Stomach is nondistended. No gastric wall thickening. No evidence of outlet obstruction. Duodenum is normally positioned as is the ligament of Treitz. No small bowel wall thickening. No small bowel dilatation. No gross colonic mass. No colonic wall thickening. No substantial diverticular change.  Vascular/Lymphatic: There is abdominal aortic  atherosclerosis without aneurysm. Bulky retroperitoneal lymphadenopathy again noted. 2.1 x 2.6 cm left para-aortic lymph node measured previously is now 2.3 x 3.1 cm. As noted on the prior study, there is occlusive thrombus in the IVC, extending from the level of the renal veins caudally into the common iliac and external iliac veins bilaterally.  Reproductive: The prostate gland and seminal vesicles have normal imaging features.  Other: Small amount of intraperitoneal free fluid is identified. There is diffuse body wall edema throughout.  Musculoskeletal: Soft tissue lesion obliterates the T9 spinous process (see image 3 series 2).  IMPRESSION: 1. Large left renal mass consistent with renal cell carcinoma. There is bulky associated retroperitoneal metastatic lymphadenopathy within nodules identified in the lower lungs, also consistent with metastatic involvement. Low-density lesion in the medial spleen is suspicious for splenic metastasis. 2. 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 3. Metastatic destructive lesion at T9 spinous process. No evidence for epidural tumor involvement at this level. 4. Small volume intraperitoneal free fluid with diffuse body wall edema.   Electronically Signed  By: EMisty StanleyM.D.  On: 05/12/2016 13:51   ASSESSMENT & PLAN:  Left renal mass, 8.8 x 7.8 x 9.9 cm Bulky retroperitoneal metastatic lymphadenopathy Nodules in both lungs Destructive lesion at T9 spinous process- no epidural involvement Pulmonary Emboli Thrombus in IVC extending inferiorly into the iliac veins, left renal vein, splenic vein, and SMV. RUL pneumonia Tobacco Use  Radiographic findings are suspicious for metastatic renal cell carcinoma.  The patient is advised of his radiographics findings and concerns for metastatic renal cell carcinoma.  If this is the case, he understands that he has Stage IV disease.  The  only way to definitively confirm this diagnosis is via a biopsy.  He is given some reading information regarding renal cell carcinoma.  We will ask radiology to review his scans and see what area would be mos appropriate for biopsy.  I suspect bone lesion would be most amenable for biopsy, and this would confirm metastatic disease, in addition he could be off of anticoagulation for the shortest duration. He has  been on anticoagulation for appropriate duration and we can proceed with biopsy.  Once diagnosis is confirmed, we will start therapy with a TKI (if renal cell carcinoma is biopsy proven); Sutent. Patient is educated that with treatment, he will likely feel better.  During the appointment, we spent a significant amount of time discussing the nature of chemotherapy on this disease. With treatment, and as his malignancy responds, his clot burden will improve and resolve, his energy will improve, he will gain weight, and feel better again.  Smoking cessation education is provided today. I will provide him materials for smoking cessation today. We will continue to help him with this.  We will encourage participation in smoking classes.  He is advised that we will manage his pain medications.  We may need to get some of his pain medication(s) approved through his medical insurance. He will take his prescriptions to [place], and then we will work on authorization for it.  He was provided a 3 month supply of his Lovenox previously. We will regroup post biopsy. He has met with our patient navigator, and has multiple points of contact here at the cancer center.    ASSESSMENT & PLAN (07/10/16):   -He needs Sutent refills and we will take care of that today.    -Constipation: I want him to restart the Miralax every day, to prevent further constipation. I have also prescribed for him to take Senna-S 1 tab twice daily (while this is OTC, a prescription was sent to his pharmacy as it may be cheaper with his  insurance). We reviewed the importance of preventative stool softener like Miralax, and the need to avoid too many laxatives.   -GERD: We will start him on Protonix for his persistent acid reflux. He will take the Protonix once daily, in the morning. He'd still like his prescriptions sent to [facility] in [place], which I have done today.   -Hyperkalemia, resolved:  His repeat serum potassium was normal today.  Recommended him to continue to hold any potassium supplementation for now.    -He will return to the cancer center in 1 month to see Dr. Whitney Muse, with labs prior to that visit. That will give [place] some time to see how the new prescriptions work for his stomach and bowels.   ORDERS PLACED FOR THIS ENCOUNTER: Orders Placed This Encounter  Procedures  . CBC with Differential  . Comprehensive metabolic panel    MEDICATIONS PRESCRIBED THIS ENCOUNTER: Meds ordered this encounter  Medications  . DISCONTD: DOCQLACE 100 MG capsule    Sig: Take 200 mg by mouth daily.    Refill:  0  . polyethylene glycol (MIRALAX) packet    Sig: Take 17 g by mouth daily.    Dispense:  14 each    Refill:  6    Order Specific Question:   Supervising Provider    Answer:   Hall Busing [id number]  . senna-docusate (SENNA S) 8.6-50 MG tablet    Sig: Take 1 tablet by mouth 2 (two) times daily.    Dispense:  60 tablet    Refill:  5    Order Specific Question:   Supervising Provider    Answer:   Hall Busing [id number]  . pantoprazole (PROTONIX) 40 MG tablet    Sig: Take 1 tablet (40 mg total) by mouth daily.    Dispense:  30 tablet    Refill:  5    Order Specific Question:   Supervising Provider  AnswerLorenz Coaster  . DISCONTD: SUNItinib (SUTENT) 50 MG capsule    Sig: Take 1 capsule (50 mg total) by mouth daily. Take 2 weeks on and one week off    Dispense:  14 capsule    Refill:  6    Take 50 mg PO days 1-14 every 21 days  . DISCONTD: SUNItinib (SUTENT) 50 MG capsule     Sig: Take 1 capsule (50 mg total) by mouth daily. Take 2 weeks on and one week off    Dispense:  14 capsule    Refill:  6    Take 50 mg PO days 1-14 every 21 days  . DISCONTD: SUNItinib (SUTENT) 50 MG capsule    Sig: Take 1 capsule (50 mg total) by mouth daily. Take 2 weeks on and one week off    Dispense:  14 capsule    Refill:  6    Take 50 mg PO days 1-14 every 21 days   All questions were answered. The patient knows to call the clinic with any problems, questions or concerns. We can certainly see the patient much sooner if necessary.  This document serves as a record of services personally performed by Mike Craze, NP. It was created on her behalf by Toni Amend, a trained medical scribe. The creation of this record is based on the scribe's personal observations and the provider's statements to them. This document has been checked and approved by the attending provider.  I have reviewed the above documentation for accuracy and completeness, and I agree with the above.  This note was electronically signed by:   Mike Craze, NP 07/10/2016 3:44 PM

## 2016-07-27 ENCOUNTER — Telehealth (HOSPITAL_COMMUNITY): Payer: Self-pay | Admitting: *Deleted

## 2016-07-27 ENCOUNTER — Other Ambulatory Visit (HOSPITAL_COMMUNITY): Payer: Self-pay

## 2016-07-27 DIAGNOSIS — C649 Malignant neoplasm of unspecified kidney, except renal pelvis: Secondary | ICD-10-CM

## 2016-07-27 MED ORDER — MORPHINE SULFATE ER 60 MG PO TBCR: 60.0000 mg | EXTENDED_RELEASE_TABLET | Freq: Two times a day (BID) | ORAL | 0 refills | Status: AC

## 2016-07-27 MED ORDER — MORPHINE SULFATE ER 30 MG PO TBCR: 30.0000 mg | EXTENDED_RELEASE_TABLET | Freq: Two times a day (BID) | ORAL | 0 refills | Status: AC

## 2016-07-27 MED ORDER — OXYCODONE HCL 15 MG PO TABS: 15.0000 mg | ORAL_TABLET | Freq: Four times a day (QID) | ORAL | 0 refills | Status: AC

## 2016-07-28 ENCOUNTER — Telehealth (HOSPITAL_COMMUNITY): Payer: Self-pay | Admitting: Emergency Medicine

## 2016-07-28 ENCOUNTER — Telehealth (HOSPITAL_COMMUNITY): Payer: Self-pay | Admitting: *Deleted

## 2016-07-28 NOTE — Telephone Encounter (Signed)
Spoke with pts sister to notify her that pain medication scripts were ready.  Wanted to know if he had to stay on lovenox shots.  Switched to [facility].  Sister os going to pick up starter pack today.  To call me when close to being finished so I can write him a script for that.  She is going to call me with the home health agency that comes out to the house so I can see if they will draw blood work on [employer].

## 2016-07-28 NOTE — Telephone Encounter (Signed)
Faxed lab orders to [facility] for labs to be drawn.

## 2016-07-28 NOTE — Telephone Encounter (Signed)
Patient needs a CBC diff and CMP drawn by [facility]. Patient's cg and brother neither one know the name of the company but the cg states that the [facility] nurse is supposed to come out this week. I gave her our fax #, Jenn's #, and asked that she have the nurse call Danise Mina regarding these labs. She wrote everything down and repeated it back to me.

## 2016-07-29 ENCOUNTER — Other Ambulatory Visit (HOSPITAL_COMMUNITY): Payer: Self-pay | Admitting: Hematology & Oncology

## 2016-07-29 DIAGNOSIS — C649 Malignant neoplasm of unspecified kidney, except renal pelvis: Secondary | ICD-10-CM

## 2016-07-30 ENCOUNTER — Ambulatory Visit (HOSPITAL_COMMUNITY): Payer: PRIVATE HEALTH INSURANCE

## 2016-07-31 ENCOUNTER — Ambulatory Visit (HOSPITAL_COMMUNITY): Payer: PRIVATE HEALTH INSURANCE

## 2016-07-31 NOTE — Progress Notes (Deleted)
Patient did not show for appointment. Called patient, no answer, message left on answering machine. Patient did not call back and did not show up. Dr.Penland is aware.

## 2016-08-05 ENCOUNTER — Other Ambulatory Visit: Payer: Self-pay | Admitting: Pharmacist

## 2016-08-10 ENCOUNTER — Other Ambulatory Visit (HOSPITAL_COMMUNITY): Payer: Medicaid - Out of State

## 2016-08-10 ENCOUNTER — Ambulatory Visit (HOSPITAL_COMMUNITY): Payer: Medicaid - Out of State | Admitting: Hematology & Oncology

## 2016-08-11 NOTE — Progress Notes (Signed)
This encounter was created in error - please disregard.

## 2016-08-12 ENCOUNTER — Telehealth (HOSPITAL_COMMUNITY): Payer: Self-pay | Admitting: Emergency Medicine

## 2016-08-12 NOTE — Telephone Encounter (Signed)
Spoke with hospice about Eric Villarreal.  Gave them [place] information.  Dr Cindie Laroche is his PCP.

## 2016-08-12 NOTE — Telephone Encounter (Signed)
Sister Eric Villarreal called and said that [name] was not doing well.  She she could not get him to do anything at all.  She states that she needs help.  She has called the Home Health nurse but has heard nothing back from them.  They did not come out at all last week.  I asked her about hospice.  She was very tearful but agreeable to them coming out. I spoke with Dr Whitney Muse and we placed the order for hospice and it was faced to [facility].

## 2017-05-27 IMAGING — CT CT ABD-PELV W/ CM
3 of 10 series · 11 of 46 positions shown, 17 images · IV contrast (ISOVUE)
Comparison: None

CLINICAL DATA: Spitting up blood. Blood in stool. Weakness. Angina.

EXAM:
CT ANGIOGRAPHY CHEST
CT ABDOMEN AND PELVIS WITH CONTRAST
TECHNIQUE: Multidetector CT imaging of the chest was performed using the
standard protocol during bolus administration of intravenous
contrast. Multiplanar CT image reconstructions and MIPs were
obtained to evaluate the vascular anatomy. Multidetector CT imaging
of the abdomen and pelvis was performed using the standard protocol
during bolus administration of intravenous contrast.
CONTRAST:  100 mL of Isovue 370

[Series 8: pe 2.0 · axial · 0.61mm/px · z∈[-26,+18]mm · 2 of 165 slices shown]
[im 11/165  soft-tissue]
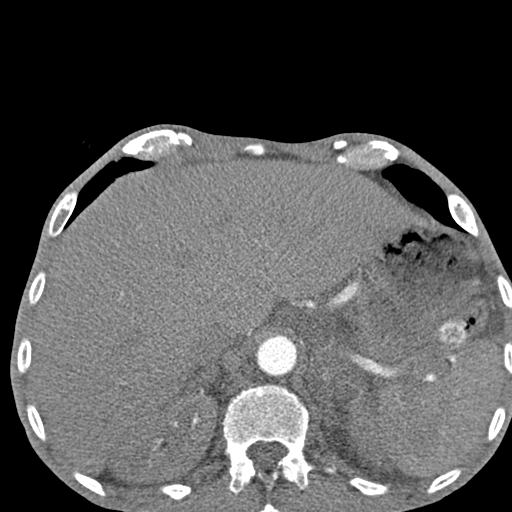
[im 33/165  soft-tissue]
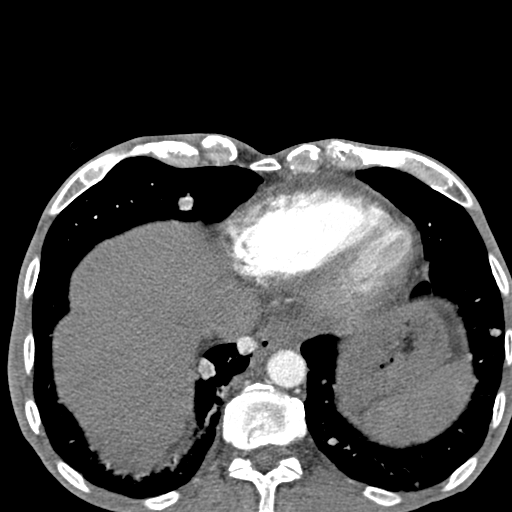

[Series 11: cor mpr 2.0 · coronal · 0.66mm/px · 2 of 133 slices shown, 3 images]
[im 45/133  soft-tissue]
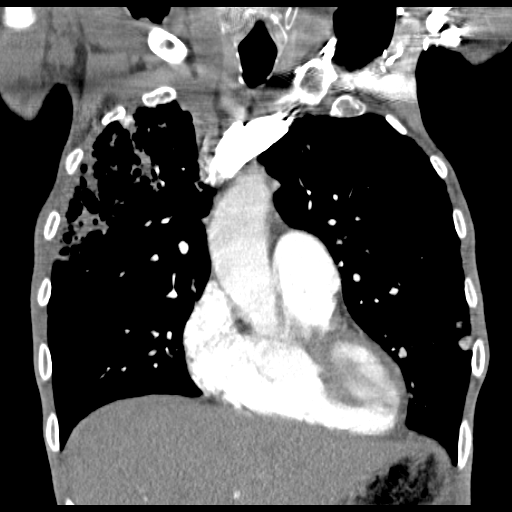
[im 45/133  bone]
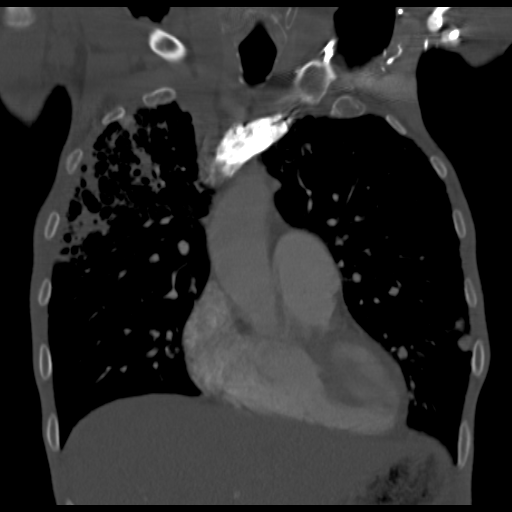
[im 89/133  soft-tissue]
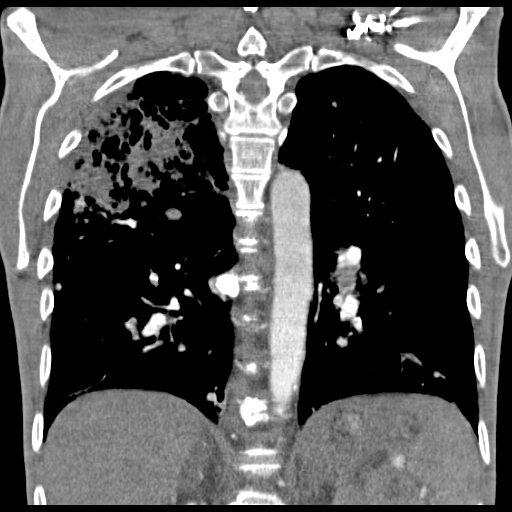

[Series 15: routine abd pel with · axial · 0.64mm/px · z∈[-362,-8]mm · 7 of 95 slices shown, 12 images]
[im 12/95  soft-tissue]
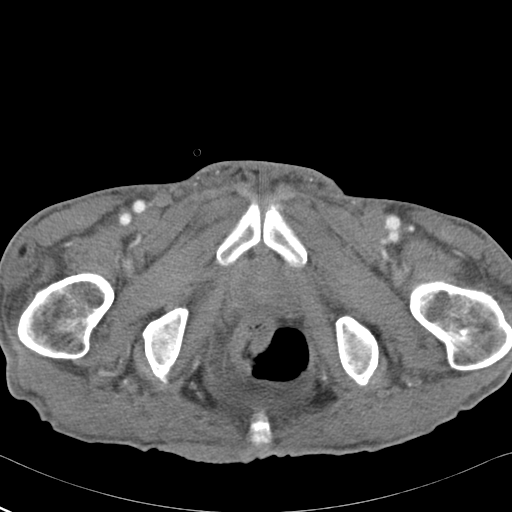
[im 12/95  bone]
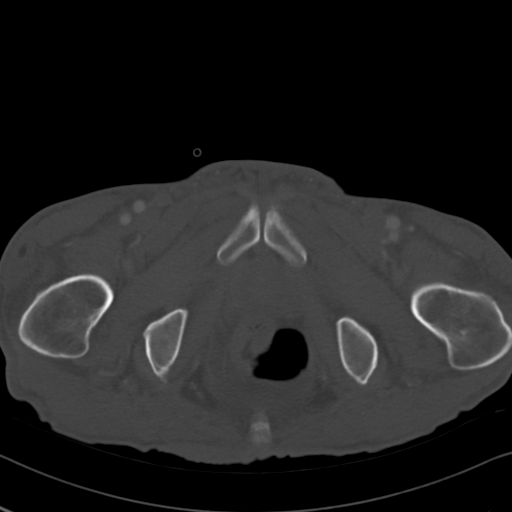
[im 24/95  soft-tissue]
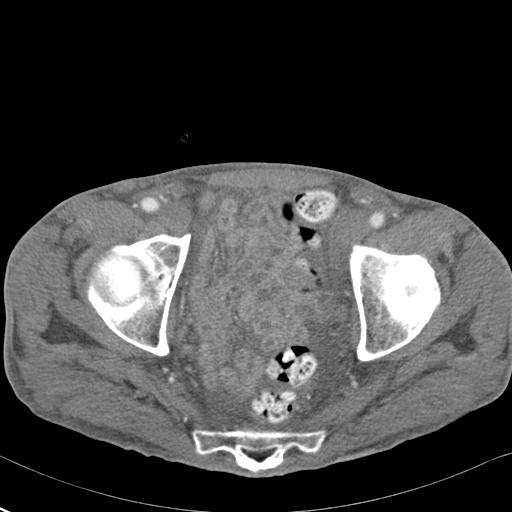
[im 36/95  soft-tissue]
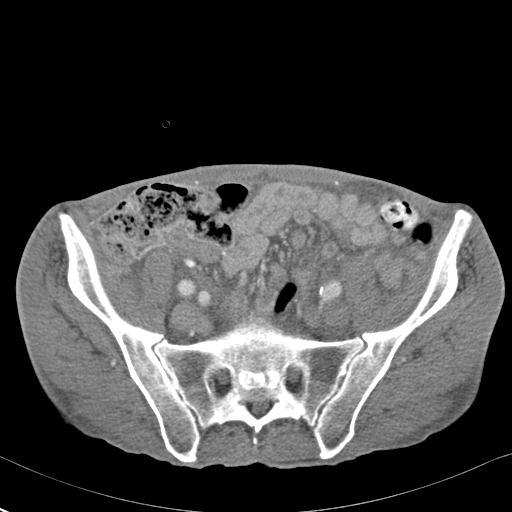
[im 48/95  soft-tissue]
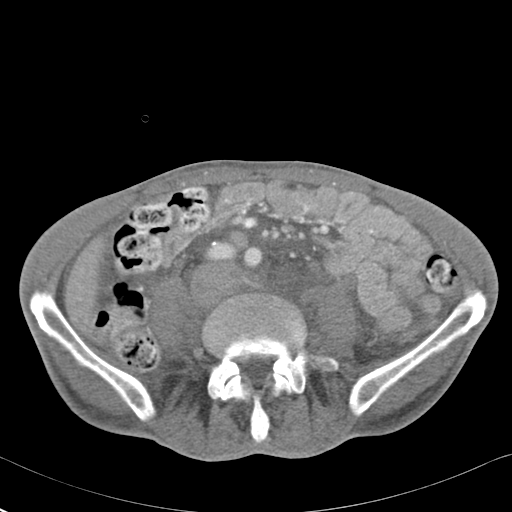
[im 48/95  lung]
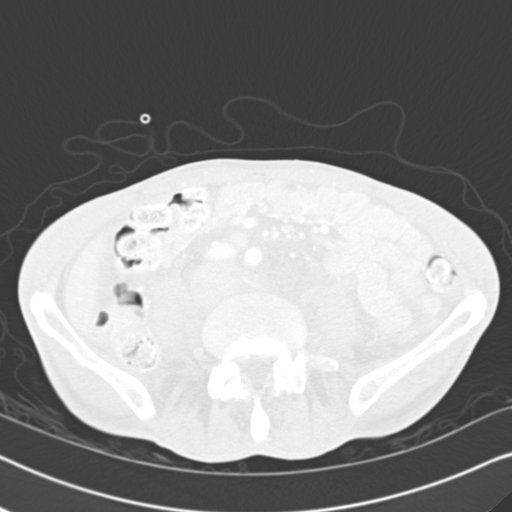
[im 59/95  soft-tissue]
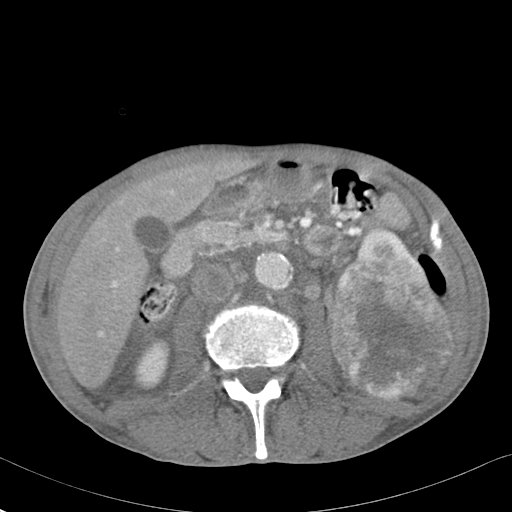
[im 59/95  lung]
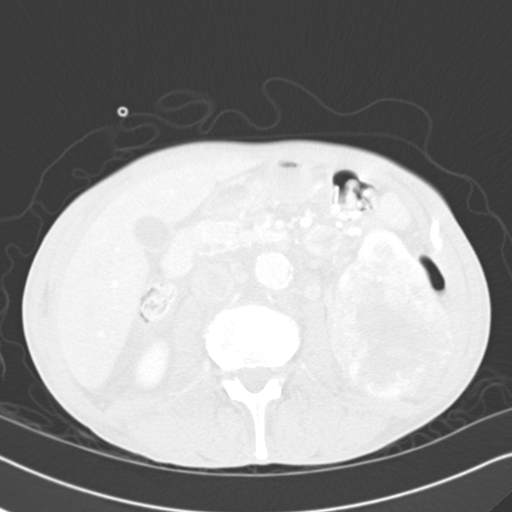
[im 71/95  soft-tissue]
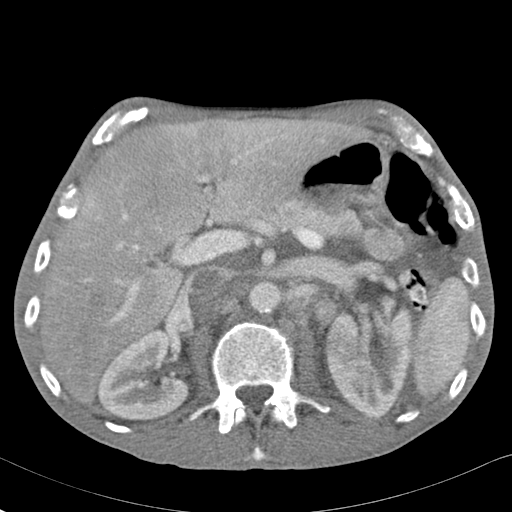
[im 71/95  lung]
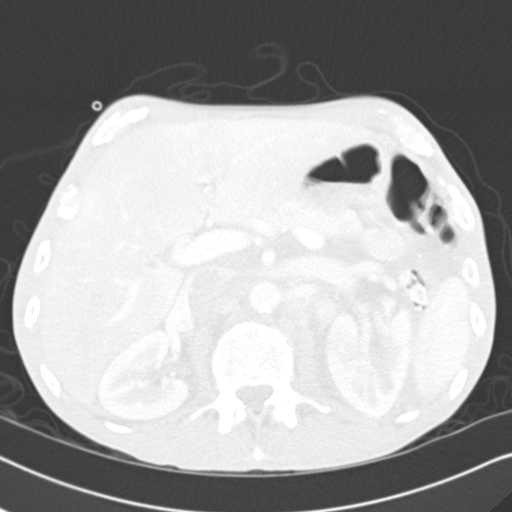
[im 83/95  soft-tissue]
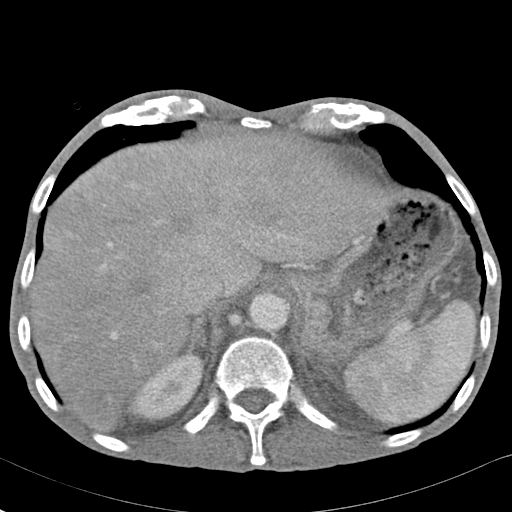
[im 83/95  lung]
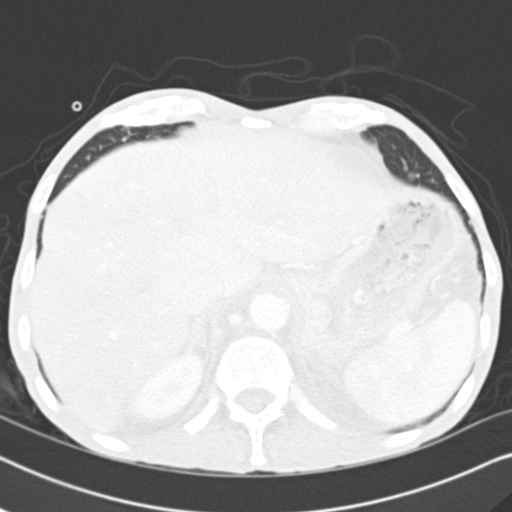

[11 of 46 positions shown; findings below may reference images not displayed]

FINDINGS: CTA CHEST FINDINGS

The central airways are normal. Evaluation of the lungs is somewhat
limited due to respiratory motion. There is widespread infiltrate in
the right upper lobe consistent with pneumonia. There are
innumerable nodules throughout both lungs consistent with metastatic
disease. A representative nodule posteriorly in the right lung base
on series 10, image 123 measures 2.7 x 2.1 cm. There is enlarged
lymph node in the right hilum on series 8, image 81 measuring 3.9 by
3.4 cm. There is a prominent subcarinal node. Mildly prominent nodes
seen in the mediastinum. The thoracic aorta measures 4.2 cm
improved. No dissection. No pleural or pericardial effusions. There
are coronary artery calcifications. The study is positive for
pulmonary emboli most prominent in the right upper and right middle
lobes. The embolus burden is relatively mild. No convincing evidence
of heart strain.

CT ABDOMEN and PELVIS FINDINGS

No free air. The patient is cachectic and there is evidence of
volume load with increased attenuation in the subcutaneous and
abdominal fat. There is a large mass in the left kidney measuring
8.8 by 7.8 by 9.9 cm, centered in the lower pole. There is no
extension of tumor into the peripheral left renal vein. There is
extensive thrombus in the IVC, probably extending inferiorly into
the iliac vessels. The thrombus extends to the level of the renal
vein drainage. There is at least 1 small filling defect in the
central left renal vein but there is no definitive connection to the
left renal mass. The defect in the left renal vein is best seen on
coronal image 59. The right kidney is normal. The intrahepatic
portions of the portal vein are patent. There does appear to be
thrombus within the confluence of the splenic and superior
mesenteric veins best seen on coronal image 43. The attenuation of
the liver is somewhat heterogeneous but no discrete metastases are
seen. The gallbladder is unremarkable. There is a mass in the medial
spleen on axial image 18. While nonspecific, a metastatic lesion is
possible measuring up to 2.6 cm. The right adrenal gland and
pancreas are normal. No discrete nodules seen in the left adrenal
gland. There are multiple abnormal lymph nodes in the left side of
the retroperitoneum between the kidney in the aorta. A
representative node to the left of the aorta on axial image 31 and
measures 2.6 by 2.1 cm. The abdominal aorta is normal in caliber.
The stomach is poorly evaluated but grossly unremarkable. The small
bowel is nonobstructed. The colon is normal. The appendix not
visualized but there is no evidence of appendicitis.

The pelvis demonstrates probable extension of thrombus into the
iliac vessels. Pelvic loops of bowel are unremarkable. The bladder
is thick-walled but otherwise unremarkable. No discrete adenopathy.

No filling defects in the renal collecting systems on delayed
images.

No bony metastatic disease.

Review of the MIP images confirms the above findings.
IMPRESSION: 1. The study is positive for pulmonary emboli. There is extensive
thrombus in the IVC extending inferiorly into the iliac veins. There
is a small amount of thrombus in the left renal vein as well as a
small amount of thrombus in the splenic and superior mesenteric
veins.
2. Left renal cell carcinoma with metastatic disease to the lungs,
right hilum, abdominal nodes, and probably mediastinal nodes. A mass
in the spleen is nonspecific but could represent a metastatic
lesion.
3. Right upper lobe pneumonia.
4. Mild aneurysmal dilatation of the thoracic aorta.
Findings called to Dr. Gc Grigg
